# Patient Record
Sex: Male | Born: 1970 | Race: White | Hispanic: No | Smoking: Former smoker
Health system: Southern US, Community
[De-identification: ages and names within clinical notes are randomized; demographics above are authoritative.]

## PROBLEM LIST (undated history)

## (undated) DIAGNOSIS — R51 Headache: Secondary | ICD-10-CM

## (undated) DIAGNOSIS — G8929 Other chronic pain: Secondary | ICD-10-CM

## (undated) DIAGNOSIS — T148XXA Other injury of unspecified body region, initial encounter: Secondary | ICD-10-CM

## (undated) DIAGNOSIS — K219 Gastro-esophageal reflux disease without esophagitis: Secondary | ICD-10-CM

## (undated) DIAGNOSIS — I509 Heart failure, unspecified: Secondary | ICD-10-CM

## (undated) DIAGNOSIS — R519 Headache, unspecified: Secondary | ICD-10-CM

## (undated) DIAGNOSIS — I10 Essential (primary) hypertension: Secondary | ICD-10-CM

## (undated) DIAGNOSIS — E162 Hypoglycemia, unspecified: Secondary | ICD-10-CM

## (undated) DIAGNOSIS — J45909 Unspecified asthma, uncomplicated: Secondary | ICD-10-CM

## (undated) DIAGNOSIS — M542 Cervicalgia: Secondary | ICD-10-CM

## (undated) HISTORY — DX: Other injury of unspecified body region, initial encounter: T14.8XXA

## (undated) HISTORY — DX: Cervicalgia: M54.2

## (undated) HISTORY — DX: Headache, unspecified: R51.9

## (undated) HISTORY — DX: Other chronic pain: G89.29

## (undated) HISTORY — DX: Heart failure, unspecified: I50.9

## (undated) HISTORY — DX: Hypoglycemia, unspecified: E16.2

## (undated) HISTORY — DX: Unspecified asthma, uncomplicated: J45.909

## (undated) HISTORY — DX: Essential (primary) hypertension: I10

## (undated) HISTORY — PX: MOUTH SURGERY: SHX715

## (undated) HISTORY — DX: Gastro-esophageal reflux disease without esophagitis: K21.9

## (undated) HISTORY — DX: Headache: R51

---

## 2005-08-24 ENCOUNTER — Emergency Department: Payer: Self-pay | Admitting: Unknown Physician Specialty

## 2018-02-02 ENCOUNTER — Encounter: Payer: Self-pay | Admitting: Adult Health

## 2018-02-02 ENCOUNTER — Ambulatory Visit: Payer: Self-pay | Admitting: Adult Health

## 2018-02-02 ENCOUNTER — Other Ambulatory Visit: Payer: Self-pay

## 2018-02-02 VITALS — BP 148/95 | HR 76 | Temp 99.9°F | Ht 75.5 in | Wt 216.3 lb

## 2018-02-02 DIAGNOSIS — R51 Headache: Secondary | ICD-10-CM

## 2018-02-02 DIAGNOSIS — Z0000 Encounter for general adult medical examination without abnormal findings: Secondary | ICD-10-CM

## 2018-02-02 DIAGNOSIS — G8929 Other chronic pain: Secondary | ICD-10-CM

## 2018-02-02 DIAGNOSIS — I10 Essential (primary) hypertension: Secondary | ICD-10-CM | POA: Insufficient documentation

## 2018-02-02 DIAGNOSIS — R0609 Other forms of dyspnea: Secondary | ICD-10-CM

## 2018-02-02 DIAGNOSIS — E663 Overweight: Secondary | ICD-10-CM

## 2018-02-02 DIAGNOSIS — E162 Hypoglycemia, unspecified: Secondary | ICD-10-CM

## 2018-02-02 DIAGNOSIS — R21 Rash and other nonspecific skin eruption: Secondary | ICD-10-CM

## 2018-02-02 DIAGNOSIS — L853 Xerosis cutis: Secondary | ICD-10-CM

## 2018-02-02 DIAGNOSIS — M542 Cervicalgia: Secondary | ICD-10-CM | POA: Insufficient documentation

## 2018-02-02 DIAGNOSIS — I509 Heart failure, unspecified: Secondary | ICD-10-CM

## 2018-02-02 DIAGNOSIS — K219 Gastro-esophageal reflux disease without esophagitis: Secondary | ICD-10-CM | POA: Insufficient documentation

## 2018-02-02 DIAGNOSIS — T148XXA Other injury of unspecified body region, initial encounter: Secondary | ICD-10-CM | POA: Insufficient documentation

## 2018-02-02 DIAGNOSIS — R519 Headache, unspecified: Secondary | ICD-10-CM | POA: Insufficient documentation

## 2018-02-02 MED ORDER — PREDNISONE 10 MG PO TABS: 10.0000 mg | ORAL_TABLET | Freq: Every day | ORAL | 0 refills | Status: AC

## 2018-02-02 MED ORDER — ALBUTEROL SULFATE HFA 108 (90 BASE) MCG/ACT IN AERS
2.0000 | INHALATION_SPRAY | Freq: Four times a day (QID) | RESPIRATORY_TRACT | 2 refills | Status: DC | PRN
Start: 2018-02-02 — End: 2018-02-02

## 2018-02-02 MED ORDER — HYDROCORTISONE 2.5 % EX OINT: TOPICAL_OINTMENT | Freq: Two times a day (BID) | CUTANEOUS | 2 refills | Status: AC

## 2018-02-02 MED ORDER — HYDROCERIN EX CREA: 1.0000 "application " | TOPICAL_CREAM | Freq: Two times a day (BID) | CUTANEOUS | 2 refills | Status: DC

## 2018-02-02 MED ORDER — CARVEDILOL 25 MG PO TABS: 25.0000 mg | ORAL_TABLET | Freq: Two times a day (BID) | ORAL | 2 refills | Status: DC

## 2018-02-02 MED ORDER — FUROSEMIDE 20 MG PO TABS: 20.0000 mg | ORAL_TABLET | Freq: Every day | ORAL | 1 refills | Status: DC

## 2018-02-02 MED ORDER — OMEPRAZOLE 20 MG PO CPDR: 20.0000 mg | DELAYED_RELEASE_CAPSULE | Freq: Every day | ORAL | 2 refills | Status: DC

## 2018-02-02 MED ORDER — POTASSIUM CHLORIDE ER 10 MEQ PO TBCR: 10.0000 meq | EXTENDED_RELEASE_TABLET | Freq: Every day | ORAL | 2 refills | Status: DC

## 2018-02-02 MED ORDER — IBUPROFEN 800 MG PO TABS: 800.0000 mg | ORAL_TABLET | Freq: Three times a day (TID) | ORAL | 1 refills | Status: DC | PRN

## 2018-02-02 MED ORDER — ALBUTEROL SULFATE HFA 108 (90 BASE) MCG/ACT IN AERS: 2.0000 | INHALATION_SPRAY | Freq: Four times a day (QID) | RESPIRATORY_TRACT | 2 refills | Status: DC | PRN

## 2018-02-02 NOTE — Patient Instructions (Addendum)
Fat and Cholesterol Restricted Diet Getting too much fat and cholesterol in your diet may cause health problems. Following this diet helps keep your fat and cholesterol at normal levels. This can keep you from getting sick. What types of fat should I choose?  Choose monosaturated and polyunsaturated fats. These are found in foods such as olive oil, canola oil, flaxseeds, walnuts, almonds, and seeds.  Eat more omega-3 fats. Good choices include salmon, mackerel, sardines, tuna, flaxseed oil, and ground flaxseeds.  Limit saturated fats. These are in animal products such as meats, butter, and cream. They can also be in plant products such as palm oil, palm kernel oil, and coconut oil.  Avoid foods with partially hydrogenated oils in them. These contain trans fats. Examples of foods that have trans fats are stick margarine, some tub margarines, cookies, crackers, and other baked goods. What general guidelines do I need to follow?  Check food labels. Look for the words "trans fat" and "saturated fat."  When preparing a meal: ? Fill half of your plate with vegetables and green salads. ? Fill one fourth of your plate with whole grains. Look for the word "whole" as the first word in the ingredient list. ? Fill one fourth of your plate with lean protein foods.  Eat more foods that have fiber, like apples, carrots, beans, peas, and barley.  Eat more home-cooked foods. Eat less at restaurants and buffets.  Limit or avoid alcohol.  Limit foods high in starch and sugar.  Limit fried foods.  Cook foods without frying them. Baking, boiling, grilling, and broiling are all great options.  Lose weight if you are overweight. Losing even a small amount of weight can help your overall health. It can also help prevent diseases such as diabetes and heart disease. What foods can I eat? Grains Whole grains, such as whole wheat or whole grain breads, crackers, cereals, and pasta. Unsweetened oatmeal,  bulgur, barley, quinoa, or brown rice. Corn or whole wheat flour tortillas. Vegetables Fresh or frozen vegetables (raw, steamed, roasted, or grilled). Green salads. Fruits All fresh, canned (in natural juice), or frozen fruits. Meat and Other Protein Products Ground beef (85% or leaner), grass-fed beef, or beef trimmed of fat. Skinless chicken or [place]. Ground chicken or [place]. Pork trimmed of fat. All fish and seafood. Eggs. Dried beans, peas, or lentils. Unsalted nuts or seeds. Unsalted canned or dry beans. Dairy Low-fat dairy products, such as skim or 1% milk, 2% or reduced-fat cheeses, low-fat ricotta or cottage cheese, or plain low-fat yogurt. Fats and Oils Tub margarines without trans fats. Light or reduced-fat mayonnaise and salad dressings. Avocado. Olive, canola, sesame, or safflower oils. Natural peanut or almond butter (choose ones without added sugar and oil). The items listed above may not be a complete list of recommended foods or beverages. Contact your dietitian for more options. What foods are not recommended? Grains White bread. White pasta. White rice. Cornbread. Bagels, pastries, and croissants. Crackers that contain trans fat. Vegetables White potatoes. Corn. Creamed or fried vegetables. Vegetables in a cheese sauce. Fruits Dried fruits. Canned fruit in light or heavy syrup. Fruit juice. Meat and Other Protein Products Fatty cuts of meat. Ribs, chicken wings, bacon, sausage, bologna, salami, chitterlings, fatback, hot dogs, bratwurst, and packaged luncheon meats. Liver and organ meats. Dairy Whole or 2% milk, cream, half-and-half, and cream cheese. Whole milk cheeses. Whole-fat or sweetened yogurt. Full-fat cheeses. Nondairy creamers and whipped toppings. Processed cheese, cheese spreads, or cheese curds. Sweets and Desserts  Corn syrup, sugars, honey, and molasses. Candy. Jam and jelly. Syrup. Sweetened cereals. Cookies, pies, cakes, donuts, muffins, and ice  cream. Fats and Oils Butter, stick margarine, lard, shortening, ghee, or bacon fat. Coconut, palm kernel, or palm oils. Beverages Alcohol. Sweetened drinks (such as sodas, lemonade, and fruit drinks or punches). The items listed above may not be a complete list of foods and beverages to avoid. Contact your dietitian for more information. This information is not intended to replace advice given to you by your health care provider. Make sure you discuss any questions you have with your health care provider. Document Released: 02/01/2012 Document Revised: 04/08/2016 Document Reviewed: 11/01/2013 Elsevier Interactive Patient Education  2018 [employer]  Shortness of Breath, Adult Shortness of breath means you have trouble breathing. Your lungs are organs for breathing. Follow these instructions at home: Pay attention to any changes in your symptoms. Take these actions to help with your condition:  Do not smoke. Smoking can cause shortness of breath. If you need help to quit smoking, ask your doctor.  Avoid things that can make it harder to breathe, such as: ? Mold. ? Dust. ? Air pollution. ? Chemical smells. ? Things that can cause allergy symptoms (allergens), if you have allergies.  Keep your living space clean and free of mold and dust.  Rest as needed. Slowly return to your usual activities.  Take over-the-counter and prescription medicines, including oxygen and inhaled medicines, only as told by your doctor.  Keep all follow-up visits as told by your doctor. This is important.  Contact a doctor if:  Your condition does not get better as soon as expected.  You have a hard time doing your normal activities, even after you rest.  You have new symptoms. Get help right away if:  You have trouble breathing when you are resting.  You feel light-headed or you faint.  You have a cough that is not helped by medicines.  You cough up blood.  You have pain with  breathing.  You have pain in your chest, arms, shoulders, or belly (abdomen).  You have a fever.  You cannot walk up stairs.  You cannot exercise the way you normally do. This information is not intended to replace advice given to you by your health care provider. Make sure you discuss any questions you have with your health care provider. Document Released: 01/19/2008 Document Revised: 08/19/2016 Document Reviewed: 08/19/2016 Elsevier Interactive Patient Education  2017 [employer]

## 2018-02-02 NOTE — Progress Notes (Signed)
Patient ID: Matthew Marquez, male   DOB: [birth year], 47 y.o.   MRN: [id number]  Chief Complaint  Patient presents with  . Shortness of Breath  . Headache   Initial office visit  HPI Matthew Marquez is a 47 y.o. male who presents for an initial office visit and health assessment. He was just recently released from prison. He is c/o shortness of breath x 1 week. Symptoms have gotten worse over the last 3 days. Occurs both at rest and with exertion. Associated with orthopnea and a non-productive cough.Minimal relief with rest. No associated chest pain, but report palpitations. He has hypertension and currently takes hydrochlorothiazide and carvedilol that he was prescribed 4 years ago. While in prison, he was taking the coreg and another diuretic that he can't remember.  He reports being diagnosed with congestive heart failure at one point but doesn't remember if he had an echo or not. He is also c/o a rash that has persisted for about 6 months. Blister rash, that itches and burns. He report minimal relief with OTC creams and scabies treatment x 2 while in jail. Had similar symptoms prior to incarceration.  C/O headaches that occur mostly in the morning and at night. No associated photophobia, nausea and vomiting. Headaches associated with aura. Reports moderate relief with BCP powder, motrin and tylenol.  He reports variations in appetite from good to poor. He was on prilosec for GERD and reports worsening heart burn with meals. States that food seems to get stuck in his esophagus.   Past Medical History:  Diagnosis Date  . Acid reflux   . Bruising   . Cervicalgia   . Chronic headaches   . Congestive heart failure ([place])    pt unsure if correct dx  . Hypertension   . Hypoglycemia      Family History  Problem Relation Age of Onset  . Cancer Mother   . Diabetes Maternal Grandmother   . Hypertension Maternal Grandmother     Social History Social History   Tobacco Use  . Smoking  status: Former Smoker    Last attempt to quit: 2015    Years since quitting: 4.4  . Smokeless tobacco: Never Used  Substance Use Topics  . Alcohol use: Not on file  . Drug use: Not on file    No Known Allergies  Current Outpatient Medications  Medication Sig Dispense Refill  . carvedilol (COREG) 25 MG tablet Take 1 tablet (25 mg total) by mouth 2 (two) times daily with a meal. 90 tablet 2  . diphenhydrAMINE (BENADRYL) 25 MG tablet Take 25 mg by mouth every 6 (six) hours as needed.    Marland Kitchen albuterol (PROVENTIL HFA;VENTOLIN HFA) 108 (90 Base) MCG/ACT inhaler Inhale 2 puffs into the lungs every 6 (six) hours as needed for wheezing or shortness of breath. 1 Inhaler 2  . furosemide (LASIX) 20 MG tablet Take 1 tablet (20 mg total) by mouth daily. 30 tablet 1  . hydrocerin (EUCERIN) CREA Apply 1 application topically 2 (two) times daily. 450 g 2  . hydrocortisone 2.5 % ointment Apply topically 2 (two) times daily for 14 days. 60 g 2  . ibuprofen (ADVIL,MOTRIN) 800 MG tablet Take 1 tablet (800 mg total) by mouth 3 (three) times daily as needed. 90 tablet 1  . omeprazole (PRILOSEC) 20 MG capsule Take 1 capsule (20 mg total) by mouth daily. 90 capsule 2  . predniSONE (DELTASONE) 10 MG tablet Take 1 tablet (10 mg total) by mouth daily  with breakfast for 7 days. 7 tablet 0   No current facility-administered medications for this visit.     Review of Systems Review of Systems  Constitutional: Positive for fatigue.  HENT: Positive for sinus pressure and sinus pain.   Eyes: Negative.   Respiratory: Positive for shortness of breath.   Cardiovascular: Positive for palpitations.  Gastrointestinal: Negative.   Endocrine: Negative.   Genitourinary: Negative.   Musculoskeletal: Negative.   Skin: Positive for rash.  Allergic/Immunologic: Negative.   Neurological: Positive for headaches.  Hematological: Bruises/bleeds easily.  Psychiatric/Behavioral: Negative.     Blood pressure (!) 148/95, pulse  76, temperature 99.9 F (37.7 C), height 6' 3.5" (1.918 m), weight 216 lb 4.8 oz (98.1 kg).  Physical Exam Physical Exam  Constitutional: He is oriented to person, place, and time. He appears well-developed and well-nourished.  HENT:  Head: Normocephalic and atraumatic.  Mouth/Throat: Oropharynx is clear and moist.  Eyes: Pupils are equal, round, and reactive to light. EOM are normal.  Neck: Normal range of motion. Neck supple. No JVD present. No thyromegaly present.  Cardiovascular: Normal rate, regular rhythm, normal heart sounds and intact distal pulses.  Pulses:      Carotid pulses are on the right side with bruit, and on the left side with bruit. Pulmonary/Chest: Effort normal and breath sounds normal.  Abdominal: Soft. Bowel sounds are normal.  Musculoskeletal: Normal range of motion.       Right lower leg: Normal.       Left lower leg: Normal.  Neurological: He is alert and oriented to person, place, and time.  Skin: Skin is warm and dry. Rash noted.  Psychiatric: He has a normal mood and affect. His behavior is normal.  Nursing note and vitals reviewed.   Data Reviewed Ordered labs: CBC, CMP, lipid panel, Hg A1C, BNP and TSH  Assessment and plan 1. Health maintenance examination Exam unremarkable. Will screen for cardiovascular risk factors and complications - Hemoglobin A1c - Lipid panel - TSH - B Nat Peptide - Comp Met (CMET) - CBC w/Diff - Urine Microalbumin w/creat. ratio - Urinalysis, Routine w reflex microscopic - DG Chest Portable 2 Views (NEONATE); Future  2. Overweight with body mass index (BMI) 25.0-29.9 Weight loss/life style modification advice given - Hemoglobin A1c - Lipid panel - TSH - B Nat Peptide - Comp Met (CMET) - CBC w/Diff - Urine Microalbumin w/creat. ratio - Urinalysis, Routine w reflex microscopic - DG Chest Portable 2 Views (NEONATE); Future  3. Hypoglycemia No recent episodes. S/SX of hypoglycemia reviewed with patient  4.  Hypertension, unspecified type Moderate control. D/C HCTZ. Patient advised to stop taking old medications. Refills provided for carvedilol and new script given for lasix 20 mg daily  5. Congestive heart failure, unspecified HF chronicity, unspecified heart failure type ([place]) No signs of volume overload on exam - B Nat Peptide  - PCXR -Will consider cardiology referral if no improvement with lasix and if CXR and labs are  abnormal.   6. Chronic nonintractable headache, unspecified headache type-most likely migraines Prn motrin  7. Cervicalgia PRN motrin and daily exercises reviewed. Will obtain x-ray if no improvement  8. Bruising No acute bruising noted. Will review labs  11. Dry skin dermatitis Patient advised to use Eucerin cream with Vaseline bid  RTC in 1 week [facility] care application  Magddalene S Tukov-Yual 02/02/2018, 3:20 PM

## 2018-02-03 ENCOUNTER — Ambulatory Visit
Admission: RE | Admit: 2018-02-03 | Discharge: 2018-02-03 | Disposition: A | Discharge status: Home / Self Care | Payer: Self-pay | Source: Ambulatory Visit | Attending: Adult Health | Admitting: Adult Health

## 2018-02-03 ENCOUNTER — Encounter
Admission: RE | Admit: 2018-02-03 | Discharge: 2018-02-03 | Disposition: A | Discharge status: Home / Self Care | Payer: Self-pay | Source: Ambulatory Visit | Attending: Adult Health | Admitting: Adult Health

## 2018-02-03 DIAGNOSIS — I509 Heart failure, unspecified: Secondary | ICD-10-CM | POA: Insufficient documentation

## 2018-02-03 DIAGNOSIS — E049 Nontoxic goiter, unspecified: Secondary | ICD-10-CM | POA: Insufficient documentation

## 2018-02-03 LAB — HEMOGLOBIN A1C
Est. average glucose Bld gHb Est-mCnc: 108 mg/dL
Hgb A1c MFr Bld: 5.4 % (ref 4.8–5.6)

## 2018-02-03 LAB — CBC WITH DIFFERENTIAL/PLATELET
BASOS ABS: 0.1 10*3/uL (ref 0.0–0.2)
Basos: 1 %
EOS (ABSOLUTE): 0.3 10*3/uL (ref 0.0–0.4)
EOS: 4 %
HEMATOCRIT: 41.3 % (ref 37.5–51.0)
HEMOGLOBIN: 14 g/dL (ref 13.0–17.7)
IMMATURE GRANS (ABS): 0 10*3/uL (ref 0.0–0.1)
Immature Granulocytes: 1 %
LYMPHS: 22 %
Lymphocytes Absolute: 1.7 10*3/uL (ref 0.7–3.1)
MCH: 29.7 pg (ref 26.6–33.0)
MCHC: 33.9 g/dL (ref 31.5–35.7)
MCV: 88 fL (ref 79–97)
MONOCYTES: 12 %
Monocytes Absolute: 1 10*3/uL — ABNORMAL HIGH (ref 0.1–0.9)
NEUTROS ABS: 5 10*3/uL (ref 1.4–7.0)
Neutrophils: 60 %
Platelets: 284 10*3/uL (ref 150–450)
RBC: 4.72 x10E6/uL (ref 4.14–5.80)
RDW: 14.5 % (ref 12.3–15.4)
WBC: 8.1 10*3/uL (ref 3.4–10.8)

## 2018-02-03 LAB — LIPID PANEL
CHOLESTEROL TOTAL: 215 mg/dL — AB (ref 100–199)
Chol/HDL Ratio: 5.5 ratio — ABNORMAL HIGH (ref 0.0–5.0)
HDL: 39 mg/dL — AB (ref >39)
LDL CALC: 130 mg/dL — AB (ref 0–99)
Triglycerides: 231 mg/dL — ABNORMAL HIGH (ref 0–149)
VLDL CHOLESTEROL CAL: 46 mg/dL — AB (ref 5–40)

## 2018-02-03 LAB — COMPREHENSIVE METABOLIC PANEL
ALBUMIN: 4.4 g/dL (ref 3.5–5.5)
ALT: 23 IU/L (ref 0–44)
AST: 17 IU/L (ref 0–40)
Albumin/Globulin Ratio: 1.6 (ref 1.2–2.2)
Alkaline Phosphatase: 53 IU/L (ref 39–117)
BUN / CREAT RATIO: 12 (ref 9–20)
BUN: 14 mg/dL (ref 6–24)
Bilirubin Total: 0.2 mg/dL (ref 0.0–1.2)
CO2: 25 mmol/L (ref 20–29)
CREATININE: 1.19 mg/dL (ref 0.76–1.27)
Calcium: 10 mg/dL (ref 8.7–10.2)
Chloride: 107 mmol/L — ABNORMAL HIGH (ref 96–106)
GFR calc non Af Amer: 72 mL/min/{1.73_m2} (ref >59)
GFR, EST AFRICAN AMERICAN: 83 mL/min/{1.73_m2} (ref >59)
Globulin, Total: 2.8 g/dL (ref 1.5–4.5)
Glucose: 81 mg/dL (ref 65–99)
Potassium: 5.3 mmol/L — ABNORMAL HIGH (ref 3.5–5.2)
Sodium: 148 mmol/L — ABNORMAL HIGH (ref 134–144)
TOTAL PROTEIN: 7.2 g/dL (ref 6.0–8.5)

## 2018-02-03 LAB — MICROALBUMIN / CREATININE URINE RATIO
CREATININE, UR: 139.6 mg/dL
MICROALBUM., U, RANDOM: 22.4 ug/mL
Microalb/Creat Ratio: 16 mg/g creat (ref 0.0–30.0)

## 2018-02-03 LAB — TSH: TSH: 1.62 u[IU]/mL (ref 0.450–4.500)

## 2018-02-03 LAB — BRAIN NATRIURETIC PEPTIDE: BNP: 41.9 pg/mL (ref 0.0–100.0)

## 2018-02-09 ENCOUNTER — Ambulatory Visit: Payer: Self-pay | Admitting: Adult Health

## 2018-02-14 ENCOUNTER — Ambulatory Visit: Payer: Medicaid Other | Admitting: Adult Health Nurse Practitioner

## 2018-02-14 ENCOUNTER — Telehealth: Payer: Self-pay | Admitting: Pharmacy Technician

## 2018-02-14 DIAGNOSIS — E785 Hyperlipidemia, unspecified: Secondary | ICD-10-CM | POA: Insufficient documentation

## 2018-02-14 DIAGNOSIS — I509 Heart failure, unspecified: Secondary | ICD-10-CM

## 2018-02-14 DIAGNOSIS — J449 Chronic obstructive pulmonary disease, unspecified: Secondary | ICD-10-CM | POA: Insufficient documentation

## 2018-02-14 DIAGNOSIS — E782 Mixed hyperlipidemia: Secondary | ICD-10-CM

## 2018-02-14 DIAGNOSIS — E079 Disorder of thyroid, unspecified: Secondary | ICD-10-CM | POA: Insufficient documentation

## 2018-02-14 MED ORDER — ALBUTEROL SULFATE HFA 108 (90 BASE) MCG/ACT IN AERS: 2.0000 | INHALATION_SPRAY | Freq: Four times a day (QID) | RESPIRATORY_TRACT | 2 refills | Status: DC | PRN

## 2018-02-14 NOTE — Progress Notes (Signed)
  Patient: Matthew Marquez Male    DOB: May 05, 1971   47 y.o.   MRN: [id number] Visit Date: 02/14/2018  Today's Provider: Jacelyn Pieah Doles-Johnson, NP   Chief Complaint  Patient presents with  . Shortness of Breath  . Follow-up    xray and blood work  . Medication Refill    prednisone   Subjective:    HPI  LDL-130 at this time. States he has no history of HLD.    States that he has decreased energy, and SOB.  States that he used to smoke x 40 years up to 2 ppd.  Pt endorses childhood asthma.      CXR showed incidental right thyroid enlargement/mass.  Pt states that he has been told this before but never sought treatment.  TSH 1.620.  No Known Allergies Previous Medications   ALBUTEROL (PROVENTIL HFA;VENTOLIN HFA) 108 (90 BASE) MCG/ACT INHALER    Inhale 2 puffs into the lungs every 6 (six) hours as needed for wheezing or shortness of breath.   CARVEDILOL (COREG) 25 MG TABLET    Take 1 tablet (25 mg total) by mouth 2 (two) times daily with a meal.   DIPHENHYDRAMINE (BENADRYL) 25 MG TABLET    Take 25 mg by mouth every 6 (six) hours as needed.   FUROSEMIDE (LASIX) 20 MG TABLET    Take 1 tablet (20 mg total) by mouth daily.   HYDROCERIN (EUCERIN) CREA    Apply 1 application topically 2 (two) times daily.   HYDROCORTISONE 2.5 % OINTMENT    Apply topically 2 (two) times daily for 14 days.   IBUPROFEN (ADVIL,MOTRIN) 800 MG TABLET    Take 1 tablet (800 mg total) by mouth 3 (three) times daily as needed.   OMEPRAZOLE (PRILOSEC) 20 MG CAPSULE    Take 1 capsule (20 mg total) by mouth daily.   POTASSIUM CHLORIDE (K-DUR) 10 MEQ TABLET    Take 1 tablet (10 mEq total) by mouth daily.    Review of Systems  Respiratory: Positive for cough and shortness of breath.        Non-productive cough    Social History   Tobacco Use  . Smoking status: Former Smoker    Last attempt to quit: 2015    Years since quitting: 4.5  . Smokeless tobacco: Never Used  Substance Use Topics  . Alcohol use: Not  on file   Objective:   BP 138/88   Pulse 69   Temp 98.1 F (36.7 C)   Wt 213 lb 12.8 oz (97 kg)   BMI 26.37 kg/m   Physical Exam  Constitutional: He appears well-developed and well-nourished.  Neck: Thyromegaly present.  Cardiovascular: Normal rate, regular rhythm and normal heart sounds.  Pulmonary/Chest: Effort normal and breath sounds normal. No respiratory distress. He has no wheezes.  Abdominal: Soft. Bowel sounds are normal.  Skin: Skin is warm and dry.        Assessment & Plan:        Stop Potassium supplementation at this time due to K of 5.3  Will reevaluate on next labs.   COPD:  Continue Albuterol PRN. Will send rx to [facility]. Monitor for worsening symptoms.   FU in 4 weeks to review symptoms- if not improved may consider adding Spiriva.   Discussed HLD- would like to modify diet and increase exercise.  Reevaluate with next labs.   Will order thyroid ultrasound.    Jacelyn Pieah Doles-Johnson, NP   [facility] [place] [place]

## 2018-02-14 NOTE — Telephone Encounter (Signed)
Unable to reach patient by phone.  Phone number listed is no longer working.  Patient needs to complete eligibility.  No additional medication assistance will be provided [facility] until eligibility has been determined.    [facility].  [facility] will notifiy patient that [facility] still needs to [facility].  Sherilyn DacostaBetty J. Revonda Marquez Care Manager [facility]

## 2018-02-21 ENCOUNTER — Ambulatory Visit: Payer: Medicaid Other

## 2018-03-20 ENCOUNTER — Ambulatory Visit: Payer: Medicaid Other | Admitting: Pharmacy Technician

## 2018-03-20 NOTE — Progress Notes (Signed)
Patient scheduled for eligibility appointment at [facility].  Patient did not show for the appointment on March 20, 2018 at 9:00a.m.  Contacted patient.  Patient stated that his means of transportation is motorcycle.  Was concerned about rain, so decided not to come to the appointment.  Appointment has been rescheduled to August 8th at 10:30a.m.Matthew Marquez.  [facility] unable to provide additional medication assistance until eligibility is determined.  Patient verbally acknowledged over phone that he understood.  Sherilyn DacostaBetty J. [place] Care Manager [facility]

## 2018-03-21 ENCOUNTER — Ambulatory Visit: Payer: Medicaid Other

## 2018-03-23 ENCOUNTER — Encounter (INDEPENDENT_AMBULATORY_CARE_PROVIDER_SITE_OTHER): Payer: Self-pay

## 2018-03-23 ENCOUNTER — Ambulatory Visit: Payer: Medicaid Other | Admitting: Pharmacy Technician

## 2018-03-23 DIAGNOSIS — Z79899 Other long term (current) drug therapy: Secondary | ICD-10-CM

## 2018-03-23 NOTE — Progress Notes (Signed)
Met with patient completed financial assistance application for [place] due to recent hospital visit.  Patient agreed to be responsible for gathering financial information and forwarding to appropriate department in [facility].    Patient shared that he had seen a provider at [facility] a little over a month ago.  Provider prescribed a rescue inhaler and hydrocortisone for bumps on his hands.  Patient feels like his breathing has not improved.  Patient also stated that his rash is not better on as hands as well.  Has had scabies in the past.  Patient asked that I send a referral to [facility].  Sent request to Lorrie Carter-[facility].  Completed [facility] application and contract.  Patient agreed to all terms of the [facility] contract.    Patient approved to receive medication assistance at [facility], as long as eligibility criteria continues to be met.   Provided patient with Civil engineer, contracting based on his particular needs.    [facility] Prescription Application completed with patient.  Forwarded to [facility] for signature.  Upon receipt of signed application from provider, [facility] Prescription Application will be submitted to [place].  [place] [facility]

## 2018-04-19 ENCOUNTER — Telehealth: Payer: Self-pay | Admitting: Pharmacist

## 2018-04-19 NOTE — Telephone Encounter (Signed)
04/19/2018 8:45:17 AM - [facility] HFA  04/19/18 Faxed [employer] application for enrollment for [facility] HFA Inhale 2 puffs into the lungs every 6 hours as needed for wheezing or shortness of breath.Forde Radon

## 2018-06-06 ENCOUNTER — Telehealth: Payer: Self-pay | Admitting: Urology

## 2018-06-06 ENCOUNTER — Ambulatory Visit: Payer: Medicaid Other | Admitting: Urology

## 2018-06-06 VITALS — BP 149/92 | HR 75 | Temp 97.6°F | Ht 71.0 in | Wt 215.5 lb

## 2018-06-06 DIAGNOSIS — L989 Disorder of the skin and subcutaneous tissue, unspecified: Secondary | ICD-10-CM

## 2018-06-06 DIAGNOSIS — I509 Heart failure, unspecified: Secondary | ICD-10-CM

## 2018-06-06 MED ORDER — DIPHENHYDRAMINE HCL 25 MG PO TABS: 25.0000 mg | ORAL_TABLET | Freq: Four times a day (QID) | ORAL | 0 refills | Status: DC | PRN

## 2018-06-06 MED ORDER — CARVEDILOL 25 MG PO TABS: 25.0000 mg | ORAL_TABLET | Freq: Two times a day (BID) | ORAL | 2 refills | Status: DC

## 2018-06-06 MED ORDER — FLUTICASONE-SALMETEROL 250-50 MCG/DOSE IN AEPB: 1.0000 | INHALATION_SPRAY | Freq: Two times a day (BID) | RESPIRATORY_TRACT | 3 refills | Status: DC

## 2018-06-06 MED ORDER — IBUPROFEN 800 MG PO TABS: 800.0000 mg | ORAL_TABLET | Freq: Three times a day (TID) | ORAL | 1 refills | Status: DC | PRN

## 2018-06-06 MED ORDER — OMEPRAZOLE 20 MG PO CPDR: 20.0000 mg | DELAYED_RELEASE_CAPSULE | Freq: Every day | ORAL | 2 refills | Status: DC

## 2018-06-06 MED ORDER — HYDROCERIN EX CREA: 1.0000 "application " | TOPICAL_CREAM | Freq: Two times a day (BID) | CUTANEOUS | 2 refills | Status: DC

## 2018-06-06 MED ORDER — FUROSEMIDE 20 MG PO TABS: 20.0000 mg | ORAL_TABLET | Freq: Every day | ORAL | 1 refills | Status: DC

## 2018-06-06 MED ORDER — ALBUTEROL SULFATE HFA 108 (90 BASE) MCG/ACT IN AERS: 2.0000 | INHALATION_SPRAY | Freq: Four times a day (QID) | RESPIRATORY_TRACT | 2 refills | Status: DC | PRN

## 2018-06-06 NOTE — Progress Notes (Signed)
  Patient: Matthew Marquez Male    DOB: 1971/07/06   47 y.o.   MRN: [id number] Visit Date: 06/06/2018  Today's Provider: Michiel Cowboy, PA-C   Chief Complaint  Patient presents with  . Medication Refill    lasix, doesn't feel his albuterol is working  . Skin Problem    mark on face that doesn't go away - concerned about potential cancer risk   . Rash    rash in armpit that hasn't gotten better with prescribed cream    Subjective:    HPI Has been without Lasix for three months  Feels albuterol is not effective - chest x -ray show possible thyroid enlargement - thyroid US ordered - patient states he was never contacted  5 to 6 months ago he started having red bumps with fluid in them and when the bump pops and then it starts itching    Lesion on left temple that scabs over, scabs falls off, it bleeds and then starts the process over again - present for 7 months     No Known Allergies Previous Medications   No medications on file    Review of Systems  Constitutional: Negative.   HENT: Negative.   Eyes: Negative.   Respiratory: Positive for cough and chest tightness.   Cardiovascular: Negative.   Gastrointestinal: Negative.   Endocrine: Negative.   Genitourinary: Negative.   Musculoskeletal: Negative.   Allergic/Immunologic: Negative.   Neurological: Negative.   Hematological: Negative.   Psychiatric/Behavioral: Negative.     Social History   Tobacco Use  . Smoking status: Former Smoker    Last attempt to quit: 2015    Years since quitting: 4.8  . Smokeless tobacco: Never Used  Substance Use Topics  . Alcohol use: Not on file   Objective:   BP (!) 149/92   Pulse 75   Temp 97.6 F (36.4 C)   Ht 5\' 11"  (1.803 m)   Wt 215 lb 8 oz (97.8 kg)   BMI 30.06 kg/m   Physical Exam  Constitutional: He is oriented to person, place, and time. He appears well-developed and well-nourished.  HENT:  Head: Normocephalic and atraumatic.  Right Ear: External ear  normal.  Left Ear: External ear normal.  Eyes: Pupils are equal, round, and reactive to light. Conjunctivae and EOM are normal.  Neck: Normal range of motion. Neck supple.  Cardiovascular: Normal rate, regular rhythm and normal heart sounds.  Pulmonary/Chest: Effort normal and breath sounds normal.  Abdominal: Soft. Bowel sounds are normal.  Musculoskeletal: Normal range of motion.  Neurological: He is alert and oriented to person, place, and time.  Psychiatric: He has a normal mood and affect. His behavior is normal. Judgment and thought content normal.        Assessment & Plan:  1. COPD  Continue albuterol as a rescue  Add Advair 250/50 2 puffs bid  2. Suspicious lesion on face  Refer to dermatology  3. Rash  Refer to dermatology  4. CHF  Refilled Lasix  5. HTN   had been out of meds    Michiel Cowboy, PA-C   [facility]

## 2018-06-06 NOTE — Telephone Encounter (Signed)
Teah ordered a thyroid US on Mr. Matthew Marquez in July and he said no one contacted him.

## 2018-06-07 LAB — BASIC METABOLIC PANEL
BUN/Creatinine Ratio: 16 (ref 9–20)
BUN: 24 mg/dL (ref 6–24)
CALCIUM: 9.4 mg/dL (ref 8.7–10.2)
CO2: 19 mmol/L — ABNORMAL LOW (ref 20–29)
Chloride: 104 mmol/L (ref 96–106)
Creatinine, Ser: 1.5 mg/dL — ABNORMAL HIGH (ref 0.76–1.27)
GFR calc Af Amer: 63 mL/min/{1.73_m2} (ref >59)
GFR calc non Af Amer: 54 mL/min/{1.73_m2} — ABNORMAL LOW (ref >59)
GLUCOSE: 69 mg/dL (ref 65–99)
Potassium: 3.7 mmol/L (ref 3.5–5.2)
Sodium: 141 mmol/L (ref 134–144)

## 2018-06-07 LAB — LIPID PANEL
CHOLESTEROL TOTAL: 213 mg/dL — AB (ref 100–199)
Chol/HDL Ratio: 6.1 ratio — ABNORMAL HIGH (ref 0.0–5.0)
HDL: 35 mg/dL — AB (ref >39)
LDL Calculated: 134 mg/dL — ABNORMAL HIGH (ref 0–99)
Triglycerides: 221 mg/dL — ABNORMAL HIGH (ref 0–149)
VLDL CHOLESTEROL CAL: 44 mg/dL — AB (ref 5–40)

## 2018-06-20 ENCOUNTER — Ambulatory Visit: Payer: Medicaid Other | Admitting: Family Medicine

## 2018-06-20 VITALS — BP 152/86 | HR 71 | Temp 98.0°F | Ht 72.0 in | Wt 219.9 lb

## 2018-06-20 DIAGNOSIS — Z09 Encounter for follow-up examination after completed treatment for conditions other than malignant neoplasm: Secondary | ICD-10-CM

## 2018-06-20 DIAGNOSIS — L853 Xerosis cutis: Secondary | ICD-10-CM

## 2018-06-20 DIAGNOSIS — Z0000 Encounter for general adult medical examination without abnormal findings: Secondary | ICD-10-CM

## 2018-06-20 DIAGNOSIS — I10 Essential (primary) hypertension: Secondary | ICD-10-CM

## 2018-06-20 DIAGNOSIS — J4540 Moderate persistent asthma, uncomplicated: Secondary | ICD-10-CM

## 2018-06-20 DIAGNOSIS — K219 Gastro-esophageal reflux disease without esophagitis: Secondary | ICD-10-CM

## 2018-06-20 MED ORDER — DIPHENHYDRAMINE HCL 25 MG PO TABS: 25.0000 mg | ORAL_TABLET | Freq: Four times a day (QID) | ORAL | 3 refills | Status: DC | PRN

## 2018-06-20 MED ORDER — MONTELUKAST SODIUM 10 MG PO TABS: 10.0000 mg | ORAL_TABLET | Freq: Every day | ORAL | 1 refills | Status: DC

## 2018-06-20 MED ORDER — IBUPROFEN 800 MG PO TABS: 800.0000 mg | ORAL_TABLET | Freq: Three times a day (TID) | ORAL | 3 refills | Status: DC | PRN

## 2018-06-20 MED ORDER — CARVEDILOL 25 MG PO TABS: 25.0000 mg | ORAL_TABLET | Freq: Two times a day (BID) | ORAL | 1 refills | Status: DC

## 2018-06-20 MED ORDER — ALBUTEROL SULFATE (2.5 MG/3ML) 0.083% IN NEBU: 2.5000 mg | INHALATION_SOLUTION | Freq: Four times a day (QID) | RESPIRATORY_TRACT | 12 refills | Status: DC | PRN

## 2018-06-20 MED ORDER — ALBUTEROL SULFATE HFA 108 (90 BASE) MCG/ACT IN AERS: 2.0000 | INHALATION_SPRAY | Freq: Four times a day (QID) | RESPIRATORY_TRACT | 12 refills | Status: DC | PRN

## 2018-06-20 MED ORDER — FUROSEMIDE 20 MG PO TABS: 20.0000 mg | ORAL_TABLET | Freq: Every day | ORAL | 1 refills | Status: DC

## 2018-06-20 MED ORDER — OMEPRAZOLE 20 MG PO CPDR: 20.0000 mg | DELAYED_RELEASE_CAPSULE | Freq: Every day | ORAL | 2 refills | Status: DC

## 2018-06-20 NOTE — Patient Instructions (Signed)
Montelukast oral tablets What is this medicine? MONTELUKAST (mon te LOO kast) is used to prevent and treat the symptoms of asthma. It is also used to treat allergies. Do not use for an acute asthma attack. This medicine may be used for other purposes; ask your health care provider or pharmacist if you have questions. COMMON BRAND NAME(S): Singulair What should I tell my health care provider before I take this medicine? They need to know if you have any of these conditions: -liver disease -an unusual or allergic reaction to montelukast, other medicines, foods, dyes, or preservatives -pregnant or trying to get pregnant -breast-feeding How should I use this medicine? This medicine should be given by mouth. Follow the directions on the prescription label. Take this medicine at the same time every day. You may take this medicine with or without meals. Do not chew the tablets. Do not stop taking your medicine unless your doctor tells you to. Talk to your pediatrician regarding the use of this medicine in children. Special care may be needed. While this drug may be prescribed for children as young as 15 years of age for selected conditions, precautions do apply. Overdosage: If you think you have taken too much of this medicine contact a poison control center or emergency room at once. NOTE: This medicine is only for you. Do not share this medicine with others. What if I miss a dose? If you miss a dose, take it as soon as you can. If it is almost time for your next dose, take only that dose. Do not take double or extra doses. What may interact with this medicine? -anti-infectives like rifampin and rifabutin -medicines for diabetes like rosiglitazone and repaglinide -medicines for seizures like phenytoin, phenobarbital, and carbamazepine -paclitaxel This list may not describe all possible interactions. Give your health care provider a list of all the medicines, herbs, non-prescription drugs, or  dietary supplements you use. Also tell them if you smoke, drink alcohol, or use illegal drugs. Some items may interact with your medicine. What should I watch for while using this medicine? Visit your doctor or health care professional for regular checks on your progress. Tell your doctor or health care professional if your allergy or asthma symptoms do not improve. Take your medicine even when you do not have symptoms. Do not stop taking any of your medicine(s) unless your doctor tells you to. If you have asthma, talk to your doctor about what to do in an acute asthma attack. Always have your inhaled rescue medicine for asthma attacks with you. Patients and their families should watch for new or worsening thoughts of suicide or depression. Also watch for sudden changes in feelings such as feeling anxious, agitated, panicky, irritable, hostile, aggressive, impulsive, severely restless, overly excited and hyperactive, or not being able to sleep. Any worsening of mood or thoughts of suicide or dying should be reported to your health care professional right away. What side effects may I notice from receiving this medicine? Side effects that you should report to your doctor or health care professional as soon as possible: -allergic reactions like skin rash or hives, or swelling of the face, lips, or tongue -breathing problems -confusion -dark urine -fever or infection -flu-like symptoms -hallucinations -painful lumps under the skin -pain, tingling, numbness in the hands or feet -sinus pain or swelling -suicidal thoughts or other mood changes -tremors -trouble sleeping -uncontrolled muscle movements -unusual bleeding or bruising -yellowing of the eyes or skin Side effects that usually do not require   medical attention (report to your doctor or health care professional if they continue or are bothersome): -cough -dizziness -drowsiness -headache -nightmares -stomach upset -stuffy nose This list may  not describe all possible side effects. Call your doctor for medical advice about side effects. You may report side effects to FDA at 1-800-FDA-1088. Where should I keep my medicine? Keep out of the reach of children. Store at room temperature between 15 and 30 degrees C (59 and 86 degrees F). Protect from light and moisture. Keep this medicine in the original bottle. Throw away any unused medicine after the expiration date. NOTE: This sheet is a summary. It may not cover all possible information. If you have questions about this medicine, talk to your doctor, pharmacist, or health care provider.  2018 Elsevier/Gold Standard (2015-08-04 09:40:44) Albuterol inhalation solution What is this medicine? ALBUTEROL (al Gaspar Bidding) is a bronchodilator. It helps to open up the airways in your lungs to make it easier to breathe. This medicine is used to treat and to prevent bronchospasm. This medicine may be used for other purposes; ask your health care provider or pharmacist if you have questions. COMMON BRAND NAME(S): Accuneb, Proventil What should I tell my health care provider before I take this medicine? They need to know if you have any of the following conditions: -diabetes -heart disease or irregular heartbeat -high blood pressure -pheochromocytoma -seizures -thyroid disease -an unusual or allergic reaction to albuterol, levalbuterol, sulfites, other medicines, foods, dyes, or preservatives -pregnant or trying to get pregnant -breast-feeding How should I use this medicine? This medicine is used in a nebulizer. Nebulizers make a liquid into an aerosol that you breathe in through your mouth or your mouth and nose into your lungs. You will be taught how to use your nebulizer. Follow the directions on your prescription label. Take your medicine at regular intervals. Do not use more often than directed. Talk to your pediatrician regarding the use of this medicine in children. Special care may be  needed. Overdosage: If you think you have taken too much of this medicine contact a poison control center or emergency room at once. NOTE: This medicine is only for you. Do not share this medicine with others. What if I miss a dose? If you miss a dose, use it as soon as you can. If it is almost time for your next dose, use only that dose. Do not use double or extra doses. What may interact with this medicine? -anti-infectives like chloroquine and pentamidine -caffeine -cisapride -diuretics -medicines for colds -medicines for depression or emotional or psychotic conditions -medicines for weight loss including some herbal products -methadone -some antibiotics like clarithromycin, erythromycin, levofloxacin, and linezolid -some heart medicines -steroid hormones like dexamethasone, cortisone, hydrocortisone -theophylline -thyroid hormones This list may not describe all possible interactions. Give your health care provider a list of all the medicines, herbs, non-prescription drugs, or dietary supplements you use. Also tell them if you smoke, drink alcohol, or use illegal drugs. Some items may interact with your medicine. What should I watch for while using this medicine? Tell your doctor or health care professional if your symptoms do not improve. Do not use extra albuterol. Call your doctor right away if your asthma or bronchitis gets worse while you are using this medicine. If your mouth gets dry try chewing sugarless gum or sucking hard candy. Drink water as directed. What side effects may I notice from receiving this medicine? Side effects that you should report to your doctor or  health care professional as soon as possible: -allergic reactions like skin rash, itching or hives, swelling of the face, lips, or tongue -breathing problems -chest pain -feeling faint or lightheaded, falls -high blood pressure -irregular heartbeat -fever -muscle cramps or weakness -pain, tingling, numbness  in the hands or feet -vomiting Side effects that usually do not require medical attention (report to your doctor or health care professional if they continue or are bothersome): -cough -difficulty sleeping -headache -nervousness, trembling -stomach upset -stuffy or runny nose -throat irritation -unusual taste This list may not describe all possible side effects. Call your doctor for medical advice about side effects. You may report side effects to FDA at 1-800-FDA-1088. Where should I keep my medicine? Keep out of the reach of children. Store between 2 and 25 degrees C (36 and 77 degrees F). Do not freeze. Protect from light. Throw away any unused medicine after the expiration date. Most products are kept in the foil package until time of use. Some products can be used up to 1 week after they are removed from the foil pouch. Check the instructions that come with your medicine. NOTE: This sheet is a summary. It may not cover all possible information. If you have questions about this medicine, talk to your doctor, pharmacist, or health care provider.  2018 Elsevier/Gold Standard (2011-04-23 15:19:55)

## 2018-06-20 NOTE — Progress Notes (Signed)
Follow Up  Subjective:    Patient ID: Matthew Marquez, male    DOB: Jun 13, 1971, 47 y.o.   MRN: [id number]  No chief complaint on file.  HPI  [name] is a 47 year old male with a past medical history of Hypoglycemia, Hypertension, CHF, Chronic Headaches, Cervialgia, Bruising, and Acid Reflux. He is here today for follow up of his chronic diseases.   Current Status: Since his last office visit, he has been having increasing episodes of shortness of breath. Patient states that he has not been able to call Dermatology referral back because he works in [employer] and cannot get cell phone service where he works from [employer], Monday-Friday. He has multiple skin lesions that need to be assessed by Dermatologist. He will attempt to call tomorrow before referral is closed. He denies visual changes, chest pain, cough, shortness of breath, heart palpitations, and falls. He has occasionally headaches and dizziness with position changes. Denies severe headaches, confusion, seizures, double vision, and blurred vision, nausea and vomiting.  He denies fevers, chills, fatigue, recent infections, weight loss, and night sweats. He has not had any visual changes, and falls. No reports of GI problems such as diarrhea, and constipation. He has no reports of blood in stools, dysuria and hematuria. No depression or anxiety reported. He denies pain today.    Past Medical History:  Diagnosis Date  . Acid reflux   . Bruising   . Cervicalgia   . Chronic headaches   . Congestive heart failure (HCC)    pt unsure if correct dx  . Hypertension   . Hypoglycemia     Family History  Problem Relation Age of Onset  . Cancer Mother   . Diabetes Maternal Grandmother   . Hypertension Maternal Grandmother     Social History   Socioeconomic History  . Marital status: Legally Separated    Spouse name: Not on file  . Number of children: 3  . Years of education: 64  . Highest education level: GED or equivalent   Occupational History  . Occupation: unemployed  Social Needs  . Financial resource strain: Somewhat hard  . Food insecurity:    Worry: Often true    Inability: Often true  . Transportation needs:    Medical: Yes    Non-medical: Yes  Tobacco Use  . Smoking status: Former Smoker    Last attempt to quit: 2015    Years since quitting: 4.8  . Smokeless tobacco: Never Used  Substance and Sexual Activity  . Alcohol use: Yes    Alcohol/week: 1.0 standard drinks    Types: 1 Cans of beer per week    Comment: Socially / Occasionally   . Drug use: Never  . Sexual activity: Not Currently  Lifestyle  . Physical activity:    Days per week: Not on file    Minutes per session: Not on file  . Stress: Not on file  Relationships  . Social connections:    Talks on phone: Not on file    Gets together: Not on file    Attends religious service: Not on file    Active member of club or organization: Not on file    Attends meetings of clubs or organizations: Not on file    Relationship status: Not on file  . Intimate partner violence:    Fear of current or ex partner: No    Emotionally abused: No    Physically abused: No    Forced sexual activity: No  Other Topics Concern  . Not on file  Social History Narrative   Pt owns home but there is no power or water. He gets water and takes showers from his brothers house. Cooks on a grill. Right now he drives his brothers motorcycle. States he enjoys "living off the grid". Pt is single   Was on food stamps but can't reapply now. Also mentioned he is currently unemployed but taking his final GED test and looking for a job    Past Surgical History:  Procedure Laterality Date  . MOUTH SURGERY       There is no immunization history on file for this patient.  Current Meds  Medication Sig  . albuterol (PROVENTIL HFA;VENTOLIN HFA) 108 (90 Base) MCG/ACT inhaler Inhale 2 puffs into the lungs every 6 (six) hours as needed for wheezing or shortness of  breath.  . carvedilol (COREG) 25 MG tablet Take 1 tablet (25 mg total) by mouth 2 (two) times daily with a meal.  . diphenhydrAMINE (BENADRYL) 25 MG tablet Take 1 tablet (25 mg total) by mouth every 6 (six) hours as needed.  . Fluticasone-Salmeterol (ADVAIR DISKUS) 250-50 MCG/DOSE AEPB Inhale 1 puff into the lungs 2 (two) times daily.  . furosemide (LASIX) 20 MG tablet Take 1 tablet (20 mg total) by mouth daily.  . hydrocerin (EUCERIN) CREA Apply 1 application topically 2 (two) times daily.  Marland Kitchen ibuprofen (ADVIL,MOTRIN) 800 MG tablet Take 1 tablet (800 mg total) by mouth 3 (three) times daily as needed.  Marland Kitchen omeprazole (PRILOSEC) 20 MG capsule Take 1 capsule (20 mg total) by mouth daily.  . [DISCONTINUED] albuterol (PROVENTIL HFA;VENTOLIN HFA) 108 (90 Base) MCG/ACT inhaler Inhale 2 puffs into the lungs every 6 (six) hours as needed for wheezing or shortness of breath.  . [DISCONTINUED] carvedilol (COREG) 25 MG tablet Take 1 tablet (25 mg total) by mouth 2 (two) times daily with a meal.  . [DISCONTINUED] diphenhydrAMINE (BENADRYL) 25 MG tablet Take 1 tablet (25 mg total) by mouth every 6 (six) hours as needed.  . [DISCONTINUED] furosemide (LASIX) 20 MG tablet Take 1 tablet (20 mg total) by mouth daily.  . [DISCONTINUED] ibuprofen (ADVIL,MOTRIN) 800 MG tablet Take 1 tablet (800 mg total) by mouth 3 (three) times daily as needed.  . [DISCONTINUED] omeprazole (PRILOSEC) 20 MG capsule Take 1 capsule (20 mg total) by mouth daily.    No Known Allergies  BP (!) 152/86   Pulse 71   Temp 98 F (36.7 C)   Ht 6' (1.829 m)   Wt 219 lb 14.4 oz (99.7 kg)   BMI 29.82 kg/m   Objective:   Physical Exam  Constitutional: He is oriented to person, place, and time. He appears well-developed and well-nourished.  HENT:  Head: Normocephalic and atraumatic.  Eyes: Pupils are equal, round, and reactive to light. EOM are normal.  Neck: Normal range of motion. Neck supple.  Cardiovascular: Normal rate, regular  rhythm, normal heart sounds and intact distal pulses.  Pulmonary/Chest: Effort normal and breath sounds normal.  Abdominal: Soft. Bowel sounds are normal.  Neurological: He is oriented to person, place, and time.  Skin: Skin is warm and dry.  Psychiatric: He has a normal mood and affect. His behavior is normal. Judgment and thought content normal.  Nursing note and vitals reviewed.   Assessment & Plan:   1. Hypertension, unspecified type Blood pressure is elevated today at 152/86. Continue Carvadilol as prescribed. He will continue to decrease high sodium intake, excessive alcohol intake,  increase potassium intake, smoking cessation, and increase physical activity of at least 30 minutes of cardio activity daily. He will continue to follow Heart Healthy or DASH diet. - carvedilol (COREG) 25 MG tablet; Take 1 tablet (25 mg total) by mouth 2 (two) times daily with a meal.  Dispense: 180 tablet; Refill: 1 - furosemide (LASIX) 20 MG tablet; Take 1 tablet (20 mg total) by mouth daily.  Dispense: 90 tablet; Refill: 1  2. Moderate persistent asthma without complication Stable.  - albuterol (PROVENTIL) (2.5 MG/3ML) 0.083% nebulizer solution; Take 3 mLs (2.5 mg total) by nebulization every 6 (six) hours as needed for wheezing or shortness of breath.  Dispense: 75 mL; Refill: 12 - albuterol (PROVENTIL HFA;VENTOLIN HFA) 108 (90 Base) MCG/ACT inhaler; Inhale 2 puffs into the lungs every 6 (six) hours as needed for wheezing or shortness of breath.  Dispense: 1 Inhaler; Refill: 12  3. Dry skin dermatitis - diphenhydrAMINE (BENADRYL) 25 MG tablet; Take 1 tablet (25 mg total) by mouth every 6 (six) hours as needed.  Dispense: 30 tablet; Refill: 3  4. Gastroesophageal reflux disease without esophagitis - omeprazole (PRILOSEC) 20 MG capsule; Take 1 capsule (20 mg total) by mouth daily.  Dispense: 90 capsule; Refill: 2  5. Health maintenance examination  6. Follow up He will follow up in 2 months.  -  albuterol (PROVENTIL HFA;VENTOLIN HFA) 108 (90 Base) MCG/ACT inhaler; Inhale 2 puffs into the lungs every 6 (six) hours as needed for wheezing or shortness of breath.  Dispense: 1 Inhaler; Refill: 12 Meds ordered this encounter  Medications  . diphenhydrAMINE (BENADRYL) 25 MG tablet    Sig: Take 1 tablet (25 mg total) by mouth every 6 (six) hours as needed.    Dispense:  30 tablet    Refill:  3  . carvedilol (COREG) 25 MG tablet    Sig: Take 1 tablet (25 mg total) by mouth 2 (two) times daily with a meal.    Dispense:  180 tablet    Refill:  1  . furosemide (LASIX) 20 MG tablet    Sig: Take 1 tablet (20 mg total) by mouth daily.    Dispense:  90 tablet    Refill:  1  . ibuprofen (ADVIL,MOTRIN) 800 MG tablet    Sig: Take 1 tablet (800 mg total) by mouth 3 (three) times daily as needed.    Dispense:  30 tablet    Refill:  3  . omeprazole (PRILOSEC) 20 MG capsule    Sig: Take 1 capsule (20 mg total) by mouth daily.    Dispense:  90 capsule    Refill:  2  . albuterol (PROVENTIL) (2.5 MG/3ML) 0.083% nebulizer solution    Sig: Take 3 mLs (2.5 mg total) by nebulization every 6 (six) hours as needed for wheezing or shortness of breath.    Dispense:  75 mL    Refill:  12  . montelukast (SINGULAIR) 10 MG tablet    Sig: Take 1 tablet (10 mg total) by mouth at bedtime.    Dispense:  90 tablet    Refill:  1  . albuterol (PROVENTIL HFA;VENTOLIN HFA) 108 (90 Base) MCG/ACT inhaler    Sig: Inhale 2 puffs into the lungs every 6 (six) hours as needed for wheezing or shortness of breath.    Dispense:  1 Inhaler    Refill:  12    Raliegh Ip,  MSN, FNP-C [facility] [address] [place], [place] [address]  [phone]  

## 2018-07-11 ENCOUNTER — Telehealth: Payer: Self-pay | Admitting: Pharmacist

## 2018-07-11 NOTE — Telephone Encounter (Signed)
07/11/2018 2:31:52 PM - Advair 250/50 script to [facility]  07/11/18 Faxed Advair 250/50 script to [facility] for New Med.Forde RadonAJ

## 2018-08-22 ENCOUNTER — Ambulatory Visit: Payer: Medicaid Other

## 2018-11-15 ENCOUNTER — Telehealth: Payer: Self-pay | Admitting: Pharmacist

## 2018-11-15 NOTE — Telephone Encounter (Signed)
11/15/2018 9:30:47 AM - Call to [facility] for refills on Advair & Ventolin  11/15/2018 Called [facility] spoke with Macey-I faxed a script for Advair 07/11/2018 & have not received meds-according to Macey the Advair was shipped to patient's home on 07/12/18 and delivered on 07/17/2018. I was able today to place a refill for Advair 250/50 Rx#[id number] & Ventolin Rx# [id number] these will ship to our address. Patient enrollment with [facility] ends 04/20/2019.Forde Radon

## 2018-11-20 ENCOUNTER — Other Ambulatory Visit: Payer: Self-pay | Admitting: Urology

## 2018-11-27 ENCOUNTER — Other Ambulatory Visit: Payer: Self-pay | Admitting: Internal Medicine

## 2018-12-06 ENCOUNTER — Telehealth: Payer: Self-pay | Admitting: Pharmacy Technician

## 2018-12-06 NOTE — Telephone Encounter (Signed)
Patient has private insurance now through [employer]. We are unable to assist any further with medications. If the patient were to lose these benefits, he is welcome to reapply for our services.  Claybon Jabs, CPhT Recertification Specialist [facility]

## 2018-12-13 ENCOUNTER — Encounter: Payer: Self-pay | Admitting: Adult Health

## 2018-12-13 ENCOUNTER — Other Ambulatory Visit: Payer: Self-pay

## 2018-12-13 ENCOUNTER — Ambulatory Visit: Payer: 59 | Admitting: Adult Health

## 2018-12-13 VITALS — BP 144/108 | HR 80 | Resp 16 | Ht 75.0 in | Wt 217.0 lb

## 2018-12-13 DIAGNOSIS — K219 Gastro-esophageal reflux disease without esophagitis: Secondary | ICD-10-CM

## 2018-12-13 DIAGNOSIS — I10 Essential (primary) hypertension: Secondary | ICD-10-CM | POA: Diagnosis not present

## 2018-12-13 DIAGNOSIS — I509 Heart failure, unspecified: Secondary | ICD-10-CM

## 2018-12-13 DIAGNOSIS — R51 Headache: Secondary | ICD-10-CM | POA: Diagnosis not present

## 2018-12-13 DIAGNOSIS — G8929 Other chronic pain: Secondary | ICD-10-CM

## 2018-12-13 DIAGNOSIS — N529 Male erectile dysfunction, unspecified: Secondary | ICD-10-CM

## 2018-12-13 DIAGNOSIS — E049 Nontoxic goiter, unspecified: Secondary | ICD-10-CM

## 2018-12-13 MED ORDER — CARVEDILOL 25 MG PO TABS: 25.0000 mg | ORAL_TABLET | Freq: Two times a day (BID) | ORAL | 1 refills | Status: DC

## 2018-12-13 MED ORDER — LOSARTAN POTASSIUM 50 MG PO TABS: 50.0000 mg | ORAL_TABLET | Freq: Every day | ORAL | 0 refills | Status: DC

## 2018-12-13 NOTE — Patient Instructions (Signed)

## 2018-12-13 NOTE — Progress Notes (Signed)
[facility] [address] [place], [place] [address]  Internal MEDICINE  Office Visit Note  Patient Name: Matthew Marquez  [id number]  [id number]  Date of Service: 12/26/2018   Complaints/HPI Pt is here for establishment of PCP. Chief Complaint  Patient presents with  . Gastroesophageal Reflux    new patient establish care   . Allergic Rhinitis   . Headache    headaches every other day   . Hypertension    bp elevated    HPI Pt is here to establish care. He reports [facility] was [facility].  He now has insurance and needs a PCP.  He has chronic HTN for which he currently takes coreg  bid.  This does not appear to be controlling his BP at this time. He is also being treated for GERD  He denies tobacco, alcohol or illicit drug use.      Current Medication: Outpatient Encounter Medications as of 12/13/2018  Medication Sig  . ADVAIR DISKUS 250-50 MCG/DOSE AEPB INHALE 1 PUFF INTO THE LUNGS 2 TIMES A DAY  . albuterol (PROVENTIL HFA;VENTOLIN HFA) 108 (90 Base) MCG/ACT inhaler Inhale 2 puffs into the lungs every 6 (six) hours as needed for wheezing or shortness of breath.  Marland Kitchen albuterol (PROVENTIL) (2.5 MG/3ML) 0.083% nebulizer solution Take 3 mLs (2.5 mg total) by nebulization every 6 (six) hours as needed for wheezing or shortness of breath.  . diphenhydrAMINE (BENADRYL) 25 MG tablet Take 1 tablet (25 mg total) by mouth every 6 (six) hours as needed.  . furosemide (LASIX) 20 MG tablet Take 1 tablet (20 mg total) by mouth daily.  . hydrocerin (EUCERIN) CREA Apply 1 application topically 2 (two) times daily.  Marland Kitchen ibuprofen (ADVIL,MOTRIN) 800 MG tablet Take 1 tablet (800 mg total) by mouth 3 (three) times daily as needed.  . montelukast (SINGULAIR) 10 MG tablet Take 1 tablet (10 mg total) by mouth at bedtime.  Marland Kitchen omeprazole (PRILOSEC) 20 MG capsule Take 1 capsule (20 mg total) by mouth daily.  . [DISCONTINUED] carvedilol (COREG) 25 MG tablet Take 1 tablet (25 mg total)  by mouth 2 (two) times daily with a meal.  . losartan (COZAAR) 50 MG tablet Take 1 tablet (50 mg total) by mouth daily.   No facility-administered encounter medications on file as of 12/13/2018.     Surgical History: Past Surgical History:  Procedure Laterality Date  . MOUTH SURGERY      Medical History: Past Medical History:  Diagnosis Date  . Acid reflux   . Asthma   . Bruising   . Cervicalgia   . Chronic headaches   . Congestive heart failure (HCC)    pt unsure if correct dx  . Hypertension   . Hypoglycemia     Family History: Family History  Problem Relation Age of Onset  . Cancer Mother   . Diabetes Maternal Grandmother   . Hypertension Maternal Grandmother     Social History   Socioeconomic History  . Marital status: Legally Separated    Spouse name: Not on file  . Number of children: 3  . Years of education: 34  . Highest education level: GED or equivalent  Occupational History  . Occupation: unemployed  Social Needs  . Financial resource strain: Somewhat hard  . Food insecurity:    Worry: Often true    Inability: Often true  . Transportation needs:    Medical: Yes    Non-medical: Yes  Tobacco Use  . Smoking status:  Former Smoker    Last attempt to quit: 2015    Years since quitting: 5.3  . Smokeless tobacco: Never Used  Substance and Sexual Activity  . Alcohol use: Yes    Alcohol/week: 1.0 standard drinks    Types: 1 Cans of beer per week    Comment: Socially / Occasionally   . Drug use: Never  . Sexual activity: Not Currently  Lifestyle  . Physical activity:    Days per week: Not on file    Minutes per session: Not on file  . Stress: Not on file  Relationships  . Social connections:    Talks on phone: Not on file    Gets together: Not on file    Attends religious service: Not on file    Active member of club or organization: Not on file    Attends meetings of clubs or organizations: Not on file    Relationship status: Not on file  .  Intimate partner violence:    Fear of current or ex partner: No    Emotionally abused: No    Physically abused: No    Forced sexual activity: No  Other Topics Concern  . Not on file  Social History Narrative   Pt owns home but there is no power or water. He gets water and takes showers from his brothers house. Cooks on a grill. Right now he drives his brothers motorcycle. States he enjoys "living off the grid". Pt is single   Was on food stamps but can't reapply now. Also mentioned he is currently unemployed but taking his final GED test and looking for a job     Review of Systems  Constitutional: Negative.  Negative for chills, fatigue and unexpected weight change.  HENT: Negative.  Negative for congestion, rhinorrhea, sneezing and sore throat.   Eyes: Negative for redness.  Respiratory: Negative.  Negative for cough, chest tightness and shortness of breath.   Cardiovascular: Negative.  Negative for chest pain and palpitations.  Gastrointestinal: Negative.  Negative for abdominal pain, constipation, diarrhea, nausea and vomiting.  Endocrine: Negative.   Genitourinary: Negative.  Negative for dysuria and frequency.  Musculoskeletal: Negative.  Negative for arthralgias, back pain, joint swelling and neck pain.  Skin: Negative.  Negative for rash.  Allergic/Immunologic: Negative.   Neurological: Positive for headaches. Negative for tremors and numbness.  Hematological: Negative for adenopathy. Does not bruise/bleed easily.  Psychiatric/Behavioral: Negative.  Negative for behavioral problems, sleep disturbance and suicidal ideas. The patient is not nervous/anxious.     Vital Signs: BP (!) 144/108 (BP Location: Left Arm, Patient Position: Sitting, Cuff Size: Normal)   Pulse 80   Resp 16   Ht 6\' 3"  (1.905 m)   Wt 217 lb (98.4 kg)   SpO2 98%   BMI 27.12 kg/m    Physical Exam Vitals signs and nursing note reviewed.  Constitutional:      General: He is not in acute distress.     Appearance: He is well-developed. He is not diaphoretic.  HENT:     Head: Normocephalic and atraumatic.     Mouth/Throat:     Pharynx: No oropharyngeal exudate.  Eyes:     Pupils: Pupils are equal, round, and reactive to light.  Neck:     Musculoskeletal: Normal range of motion and neck supple.     Thyroid: No thyromegaly.     Vascular: No JVD.     Trachea: No tracheal deviation.  Cardiovascular:     Rate and Rhythm:  Normal rate and regular rhythm.     Heart sounds: Normal heart sounds. No murmur. No friction rub. No gallop.   Pulmonary:     Effort: Pulmonary effort is normal. No respiratory distress.     Breath sounds: Normal breath sounds. No wheezing or rales.  Chest:     Chest wall: No tenderness.  Abdominal:     Palpations: Abdomen is soft.     Tenderness: There is no abdominal tenderness. There is no guarding.  Musculoskeletal: Normal range of motion.  Lymphadenopathy:     Cervical: No cervical adenopathy.  Skin:    General: Skin is warm and dry.  Neurological:     Mental Status: He is alert and oriented to person, place, and time.     Cranial Nerves: No cranial nerve deficit.  Psychiatric:        Behavior: Behavior normal.        Thought Content: Thought content normal.        Judgment: Judgment normal.     Assessment/Plan: 1. Hypertension, unspecified type Continue Coreg and add losartan.  Start with 25mg  daily of losartan, and increase to 50mg  if pressure/headaches do not improve.  - losartan (COZAAR) 50 MG tablet; Take 1 tablet (50 mg total) by mouth daily.  Dispense: 30 tablet; Refill: 0  2. Congestive heart failure, unspecified HF chronicity, unspecified heart failure type (HCC) Stable, continue to use lasix as ordered. Consider ordering Echo at next visit for evaluation     3. Chronic nonintractable headache, unspecified headache type Headaches are likely due to uncontrolled HTN.  Will treat HTN, and see if headaches improve.   4. Gastroesophageal reflux  disease without esophagitis PT stable, continue using Prilosec as discussed.   5. Thyroid enlargement PT has questionable enlarged thyroid on previous x-ray, and on physical exam.  Will get US of thyroid.  - US THYROID; Future  6. Erectile dysfunction, unspecified erectile dysfunction type Discussed options for Erectile dysfunction.    General Counseling: Lucien MonsJeffrey verbalizes understanding of the findings of todays visit and agrees with plan of treatment. I have discussed any further diagnostic evaluation that may be needed or ordered today. We also reviewed his medications today. he has been encouraged to call the office with any questions or concerns that should arise related to todays visit.  Orders Placed This Encounter  Procedures  . US THYROID    Meds ordered this encounter  Medications  . losartan (COZAAR) 50 MG tablet    Sig: Take 1 tablet (50 mg total) by mouth daily.    Dispense:  30 tablet    Refill:  0    Time spent: 30 Minutes   This patient was seen by Blima LedgerAdam Tarae Wooden AGNP-C in Collaboration with Dr Lyndon CodeFozia M Khan as a part of collaborative care agreement  Johnna AcostaAdam J. Braysen Cloward AGNP-C Internal Medicine

## 2018-12-20 ENCOUNTER — Other Ambulatory Visit: Payer: Self-pay | Admitting: Adult Health

## 2018-12-21 LAB — COMPREHENSIVE METABOLIC PANEL
ALT: 20 IU/L (ref 0–44)
AST: 15 IU/L (ref 0–40)
Albumin/Globulin Ratio: 1.8 (ref 1.2–2.2)
Albumin: 4.4 g/dL (ref 4.0–5.0)
Alkaline Phosphatase: 80 IU/L (ref 39–117)
BUN/Creatinine Ratio: 17 (ref 9–20)
BUN: 21 mg/dL (ref 6–24)
Bilirubin Total: 0.3 mg/dL (ref 0.0–1.2)
CO2: 24 mmol/L (ref 20–29)
Calcium: 9.4 mg/dL (ref 8.7–10.2)
Chloride: 104 mmol/L (ref 96–106)
Creatinine, Ser: 1.23 mg/dL (ref 0.76–1.27)
GFR calc Af Amer: 79 mL/min/{1.73_m2} (ref >59)
GFR calc non Af Amer: 69 mL/min/{1.73_m2} (ref >59)
Globulin, Total: 2.5 g/dL (ref 1.5–4.5)
Glucose: 124 mg/dL — ABNORMAL HIGH (ref 65–99)
Potassium: 4.1 mmol/L (ref 3.5–5.2)
Sodium: 140 mmol/L (ref 134–144)
Total Protein: 6.9 g/dL (ref 6.0–8.5)

## 2018-12-21 LAB — FERRITIN: Ferritin: 105 ng/mL (ref 30–400)

## 2018-12-21 LAB — TSH: TSH: 0.927 u[IU]/mL (ref 0.450–4.500)

## 2018-12-21 LAB — CBC WITH DIFFERENTIAL/PLATELET
Basophils Absolute: 0.1 10*3/uL (ref 0.0–0.2)
Basos: 1 %
EOS (ABSOLUTE): 0.2 10*3/uL (ref 0.0–0.4)
Eos: 4 %
Hematocrit: 43.5 % (ref 37.5–51.0)
Hemoglobin: 15 g/dL (ref 13.0–17.7)
Immature Grans (Abs): 0 10*3/uL (ref 0.0–0.1)
Immature Granulocytes: 0 %
Lymphocytes Absolute: 1.3 10*3/uL (ref 0.7–3.1)
Lymphs: 21 %
MCH: 29.2 pg (ref 26.6–33.0)
MCHC: 34.5 g/dL (ref 31.5–35.7)
MCV: 85 fL (ref 79–97)
Monocytes Absolute: 0.6 10*3/uL (ref 0.1–0.9)
Monocytes: 10 %
Neutrophils Absolute: 4.1 10*3/uL (ref 1.4–7.0)
Neutrophils: 64 %
Platelets: 272 10*3/uL (ref 150–450)
RBC: 5.13 x10E6/uL (ref 4.14–5.80)
RDW: 13.7 % (ref 11.6–15.4)
WBC: 6.4 10*3/uL (ref 3.4–10.8)

## 2018-12-21 LAB — LIPID PANEL WITH LDL/HDL RATIO
Cholesterol, Total: 214 mg/dL — ABNORMAL HIGH (ref 100–199)
HDL: 33 mg/dL — ABNORMAL LOW (ref >39)
LDL Calculated: 110 mg/dL — ABNORMAL HIGH (ref 0–99)
LDl/HDL Ratio: 3.3 ratio (ref 0.0–3.6)
Triglycerides: 355 mg/dL — ABNORMAL HIGH (ref 0–149)
VLDL Cholesterol Cal: 71 mg/dL — ABNORMAL HIGH (ref 5–40)

## 2018-12-21 LAB — IRON AND TIBC
Iron Saturation: 36 % (ref 15–55)
Iron: 107 ug/dL (ref 38–169)
Total Iron Binding Capacity: 296 ug/dL (ref 250–450)
UIBC: 189 ug/dL (ref 111–343)

## 2018-12-21 LAB — TESTOSTERONE: Testosterone: 352 ng/dL (ref 264–916)

## 2018-12-21 LAB — PROLACTIN: Prolactin: 6.6 ng/mL (ref 4.0–15.2)

## 2018-12-21 LAB — PSA: Prostate Specific Ag, Serum: 1.4 ng/mL (ref 0.0–4.0)

## 2018-12-21 LAB — VITAMIN D 25 HYDROXY (VIT D DEFICIENCY, FRACTURES): Vit D, 25-Hydroxy: 20.8 ng/mL — ABNORMAL LOW (ref 30.0–100.0)

## 2018-12-21 LAB — T4, FREE: Free T4: 1.4 ng/dL (ref 0.82–1.77)

## 2018-12-25 ENCOUNTER — Encounter: Payer: Self-pay | Admitting: Adult Health

## 2018-12-29 ENCOUNTER — Other Ambulatory Visit: Payer: 59

## 2019-01-03 ENCOUNTER — Encounter: Payer: 59 | Admitting: Adult Health

## 2019-01-24 ENCOUNTER — Ambulatory Visit: Payer: 59 | Admitting: Adult Health

## 2019-01-24 ENCOUNTER — Other Ambulatory Visit: Payer: Self-pay

## 2019-01-24 ENCOUNTER — Encounter: Payer: Self-pay | Admitting: Adult Health

## 2019-01-24 VITALS — BP 146/98 | HR 78 | Resp 16 | Ht 75.0 in | Wt 222.0 lb

## 2019-01-24 DIAGNOSIS — I10 Essential (primary) hypertension: Secondary | ICD-10-CM | POA: Diagnosis not present

## 2019-01-24 DIAGNOSIS — E785 Hyperlipidemia, unspecified: Secondary | ICD-10-CM

## 2019-01-24 DIAGNOSIS — Z0001 Encounter for general adult medical examination with abnormal findings: Secondary | ICD-10-CM

## 2019-01-24 DIAGNOSIS — N529 Male erectile dysfunction, unspecified: Secondary | ICD-10-CM

## 2019-01-24 DIAGNOSIS — R300 Dysuria: Secondary | ICD-10-CM

## 2019-01-24 DIAGNOSIS — K219 Gastro-esophageal reflux disease without esophagitis: Secondary | ICD-10-CM

## 2019-01-24 DIAGNOSIS — I509 Heart failure, unspecified: Secondary | ICD-10-CM

## 2019-01-24 MED ORDER — SILDENAFIL CITRATE 100 MG PO TABS: 50.0000 mg | ORAL_TABLET | Freq: Every day | ORAL | 1 refills | Status: DC | PRN

## 2019-01-24 MED ORDER — LOSARTAN POTASSIUM 100 MG PO TABS: 100.0000 mg | ORAL_TABLET | Freq: Every day | ORAL | 2 refills | Status: DC

## 2019-01-24 NOTE — Progress Notes (Signed)
[facility] [address] [place], [place] [address]  Internal MEDICINE  Office Visit Note  Patient Name: Matthew Marquez  [id number]  [id number]  Date of Service: 02/13/2019  Chief Complaint  Patient presents with  . Medical Management of Chronic Issues  . Annual Exam  . Hypertension    bp is elevated      HPI Pt is here for routine health maintenance examination.  He is a well-appearing 48 year old Caucasian man who has a history of hypertension, CHF, COPD, GERD, hyperlipidemia and chronic headaches. Overall patient is doing well managing his diagnosis.  His bp is slightly elevated today, he reports he did take his medication today.  He does have intermittent headaches, however, when he checks his bp during the headaches it is not elevated.  He denies tobacco, alcohol or illicit drug use.   Current Medication: Outpatient Encounter Medications as of 01/24/2019  Medication Sig  . carvedilol (COREG) 25 MG tablet Take 1 tablet (25 mg total) by mouth 2 (two) times daily with a meal.  . furosemide (LASIX) 20 MG tablet Take 1 tablet (20 mg total) by mouth daily.  Marland Kitchen. ibuprofen (ADVIL,MOTRIN) 800 MG tablet Take 1 tablet (800 mg total) by mouth 3 (three) times daily as needed.  . montelukast (SINGULAIR) 10 MG tablet Take 1 tablet (10 mg total) by mouth at bedtime.  Marland Kitchen. omeprazole (PRILOSEC) 20 MG capsule Take 1 capsule (20 mg total) by mouth daily.  . [DISCONTINUED] losartan (COZAAR) 50 MG tablet Take 1 tablet (50 mg total) by mouth daily.  Marland Kitchen. ADVAIR DISKUS 250-50 MCG/DOSE AEPB INHALE 1 PUFF INTO THE LUNGS 2 TIMES A DAY (Patient not taking: Reported on 01/24/2019)  . albuterol (PROVENTIL HFA;VENTOLIN HFA) 108 (90 Base) MCG/ACT inhaler Inhale 2 puffs into the lungs every 6 (six) hours as needed for wheezing or shortness of breath. (Patient not taking: Reported on 01/24/2019)  . albuterol (PROVENTIL) (2.5 MG/3ML) 0.083% nebulizer solution Take 3 mLs (2.5 mg total) by nebulization  every 6 (six) hours as needed for wheezing or shortness of breath. (Patient not taking: Reported on 01/24/2019)  . hydrocerin (EUCERIN) CREA Apply 1 application topically 2 (two) times daily.  Marland Kitchen. losartan (COZAAR) 100 MG tablet Take 1 tablet (100 mg total) by mouth daily.  . sildenafil (VIAGRA) 100 MG tablet Take 0.5-1 tablets (50-100 mg total) by mouth daily as needed for erectile dysfunction.  . [DISCONTINUED] diphenhydrAMINE (BENADRYL) 25 MG tablet Take 1 tablet (25 mg total) by mouth every 6 (six) hours as needed. (Patient not taking: Reported on 01/24/2019)   No facility-administered encounter medications on file as of 01/24/2019.     Surgical History: Past Surgical History:  Procedure Laterality Date  . MOUTH SURGERY      Medical History: Past Medical History:  Diagnosis Date  . Acid reflux   . Asthma   . Bruising   . Cervicalgia   . Chronic headaches   . Congestive heart failure (HCC)    pt unsure if correct dx  . Hypertension   . Hypoglycemia     Family History: Family History  Problem Relation Age of Onset  . Cancer Mother   . Diabetes Maternal Grandmother   . Hypertension Maternal Grandmother       Review of Systems  Constitutional: Negative.  Negative for chills, fatigue and unexpected weight change.  HENT: Negative.  Negative for congestion, rhinorrhea, sneezing and sore throat.   Eyes: Negative for redness.  Respiratory: Negative.  Negative for cough, chest  tightness and shortness of breath.   Cardiovascular: Negative.  Negative for chest pain and palpitations.  Gastrointestinal: Negative.  Negative for abdominal pain, constipation, diarrhea, nausea and vomiting.  Endocrine: Negative.   Genitourinary: Negative.  Negative for dysuria and frequency.  Musculoskeletal: Negative.  Negative for arthralgias, back pain, joint swelling and neck pain.  Skin: Negative.  Negative for rash.  Allergic/Immunologic: Negative.   Neurological: Negative.  Negative for tremors  and numbness.  Hematological: Negative for adenopathy. Does not bruise/bleed easily.  Psychiatric/Behavioral: Negative.  Negative for behavioral problems, sleep disturbance and suicidal ideas. The patient is not nervous/anxious.      Vital Signs: BP (!) 146/98   Pulse 78   Resp 16   Ht 6\' 3"  (1.905 m)   Wt 222 lb (100.7 kg)   SpO2 98%   BMI 27.75 kg/m    Physical Exam Vitals signs and nursing note reviewed.  Constitutional:      General: He is not in acute distress.    Appearance: He is well-developed. He is not diaphoretic.  HENT:     Head: Normocephalic and atraumatic.     Mouth/Throat:     Pharynx: No oropharyngeal exudate.  Eyes:     Pupils: Pupils are equal, round, and reactive to light.  Neck:     Musculoskeletal: Normal range of motion and neck supple.     Thyroid: No thyromegaly.     Vascular: No JVD.     Trachea: No tracheal deviation.  Cardiovascular:     Rate and Rhythm: Normal rate and regular rhythm.     Heart sounds: Normal heart sounds. No murmur. No friction rub. No gallop.   Pulmonary:     Effort: Pulmonary effort is normal. No respiratory distress.     Breath sounds: Normal breath sounds. No wheezing or rales.  Chest:     Chest wall: No tenderness.  Abdominal:     Palpations: Abdomen is soft.     Tenderness: There is no abdominal tenderness. There is no guarding.  Musculoskeletal: Normal range of motion.  Lymphadenopathy:     Cervical: No cervical adenopathy.  Skin:    General: Skin is warm and dry.  Neurological:     Mental Status: He is alert and oriented to person, place, and time.     Cranial Nerves: No cranial nerve deficit.  Psychiatric:        Behavior: Behavior normal.        Thought Content: Thought content normal.        Judgment: Judgment normal.      LABS: Recent Results (from the past 2160 hour(s))  CBC with Differential/Platelet     Status: None   Collection Time: 12/20/18 11:47 AM  Result Value Ref Range   WBC 6.4 3.4 -  10.8 x10E3/uL   RBC 5.13 4.14 - 5.80 x10E6/uL   Hemoglobin 15.0 13.0 - 17.7 g/dL   Hematocrit 29.543.5 62.137.5 - 51.0 %   MCV 85 79 - 97 fL   MCH 29.2 26.6 - 33.0 pg   MCHC 34.5 31.5 - 35.7 g/dL   RDW 30.813.7 65.711.6 - 84.615.4 %   Platelets 272 150 - 450 x10E3/uL   Neutrophils 64 Not Estab. %   Lymphs 21 Not Estab. %   Monocytes 10 Not Estab. %   Eos 4 Not Estab. %   Basos 1 Not Estab. %   Neutrophils Absolute 4.1 1.4 - 7.0 x10E3/uL   Lymphocytes Absolute 1.3 0.7 - 3.1 x10E3/uL   Monocytes  Absolute 0.6 0.1 - 0.9 x10E3/uL   EOS (ABSOLUTE) 0.2 0.0 - 0.4 x10E3/uL   Basophils Absolute 0.1 0.0 - 0.2 x10E3/uL   Immature Granulocytes 0 Not Estab. %   Immature Grans (Abs) 0.0 0.0 - 0.1 x10E3/uL  Comprehensive metabolic panel     Status: Abnormal   Collection Time: 12/20/18 11:47 AM  Result Value Ref Range   Glucose 124 (H) 65 - 99 mg/dL   BUN 21 6 - 24 mg/dL   Creatinine, Ser 1.61 0.76 - 1.27 mg/dL   GFR calc non Af Amer 69 >59 mL/min/1.73   GFR calc Af Amer 79 >59 mL/min/1.73   BUN/Creatinine Ratio 17 9 - 20   Sodium 140 134 - 144 mmol/L   Potassium 4.1 3.5 - 5.2 mmol/L   Chloride 104 96 - 106 mmol/L   CO2 24 20 - 29 mmol/L   Calcium 9.4 8.7 - 10.2 mg/dL   Total Protein 6.9 6.0 - 8.5 g/dL   Albumin 4.4 4.0 - 5.0 g/dL   Globulin, Total 2.5 1.5 - 4.5 g/dL   Albumin/Globulin Ratio 1.8 1.2 - 2.2   Bilirubin Total 0.3 0.0 - 1.2 mg/dL   Alkaline Phosphatase 80 39 - 117 IU/L   AST 15 0 - 40 IU/L   ALT 20 0 - 44 IU/L  Lipid Panel With LDL/HDL Ratio     Status: Abnormal   Collection Time: 12/20/18 11:47 AM  Result Value Ref Range   Cholesterol, Total 214 (H) 100 - 199 mg/dL   Triglycerides 096 (H) 0 - 149 mg/dL   HDL 33 (L) >04 mg/dL   VLDL Cholesterol Cal 71 (H) 5 - 40 mg/dL   LDL Calculated 540 (H) 0 - 99 mg/dL   LDl/HDL Ratio 3.3 0.0 - 3.6 ratio    Comment:                                     LDL/HDL Ratio                                             Men  Women                                1/2 Avg.Risk  1.0    1.5                                   Avg.Risk  3.6    3.2                                2X Avg.Risk  6.2    5.0                                3X Avg.Risk  8.0    6.1   Iron and TIBC     Status: None   Collection Time: 12/20/18 11:47 AM  Result Value Ref Range   Total Iron Binding Capacity 296 250 - 450 ug/dL   UIBC 981 191 - 478 ug/dL   Iron 295 38 -  169 ug/dL   Iron Saturation 36 15 - 55 %  T4, free     Status: None   Collection Time: 12/20/18 11:47 AM  Result Value Ref Range   Free T4 1.40 0.82 - 1.77 ng/dL  Testosterone     Status: None   Collection Time: 12/20/18 11:47 AM  Result Value Ref Range   Testosterone 352 264 - 916 ng/dL    Comment: Adult male reference interval is based on a population of healthy nonobese males (BMI <30) between 61 and 25 years old. [place], [place] [phone]. PMID: [id number].   TSH     Status: None   Collection Time: 12/20/18 11:47 AM  Result Value Ref Range   TSH 0.927 0.450 - 4.500 uIU/mL  Prolactin     Status: None   Collection Time: 12/20/18 11:47 AM  Result Value Ref Range   Prolactin 6.6 4.0 - 15.2 ng/mL  PSA     Status: None   Collection Time: 12/20/18 11:47 AM  Result Value Ref Range   Prostate Specific Ag, Serum 1.4 0.0 - 4.0 ng/mL    Comment: Roche ECLIA methodology. According to the American Urological Association, Serum PSA should decrease and remain at undetectable levels after radical prostatectomy. The AUA defines biochemical recurrence as an initial PSA value 0.2 ng/mL or greater followed by a subsequent confirmatory PSA value 0.2 ng/mL or greater. Values obtained with different assay methods or kits cannot be used interchangeably. Results cannot be interpreted as absolute evidence of the presence or absence of malignant disease.   VITAMIN D 25 Hydroxy (Vit-D Deficiency, Fractures)     Status: Abnormal   Collection Time: 12/20/18 11:47 AM  Result Value Ref Range   Vit D, 25-Hydroxy  20.8 (L) 30.0 - 100.0 ng/mL    Comment: Vitamin D deficiency has been defined by the [place] practice guideline as a level of serum 25-OH vitamin D less than 20 ng/mL (1,2). The Endocrine Society went on to further define vitamin D insufficiency as a level between 21 and 29 ng/mL (2). 1. IOM (Institute of Medicine). 2010. Dietary reference    intakes for calcium and D. [place]: The    [employer]. 2. Holick MF, Binkley , Bischoff-Ferrari HA, et al.    Evaluation, treatment, and prevention of vitamin D    deficiency: an Endocrine Society clinical practice    guideline. JCEM. 2011 Jul; 96(7):1911-30.   Ferritin     Status: None   Collection Time: 12/20/18 11:47 AM  Result Value Ref Range   Ferritin 105 30 - 400 ng/mL  UA/M w/rflx Culture, Routine     Status: None   Collection Time: 01/24/19 10:07 AM   Specimen: Urine   URINE  Result Value Ref Range   Specific Gravity, UA 1.021 1.005 - 1.030   pH, UA 6.0 5.0 - 7.5   Color, UA Yellow Yellow   Appearance Ur Clear Clear   Leukocytes,UA Negative Negative   Protein,UA Trace Negative/Trace   Glucose, UA Negative Negative   Ketones, UA Negative Negative   RBC, UA Negative Negative   Bilirubin, UA Negative Negative   Urobilinogen, Ur 0.2 0.2 - 1.0 mg/dL   Nitrite, UA Negative Negative   Microscopic Examination Comment     Comment: Microscopic follows if indicated.   Microscopic Examination See below:     Comment: Microscopic was indicated and was performed.   Urinalysis Reflex Comment     Comment: This specimen will  not reflex to a Urine Culture.  Microscopic Examination     Status: None   Collection Time: 01/24/19 10:07 AM   URINE  Result Value Ref Range   WBC, UA 0-5 0 - 5 /hpf   RBC 0-2 0 - 2 /hpf   Epithelial Cells (non renal) None seen 0 - 10 /hpf   Casts None seen None seen /lpf   Mucus, UA Present Not Estab.   Bacteria, UA Few None seen/Few      Assessment/Plan: 1. Encounter for general adult medical examination with abnormal findings Up to date on PHM.    2. Hypertension, unspecified type Increased patients cozaar to 100mg  daily. Hypertension Counseling:   The following hypertensive lifestyle modification were recommended and discussed:  1. Limiting alcohol intake to less than 1 oz/day of ethanol:(24 oz of beer or 8 oz of wine or 2 oz of 100-proof whiskey). 2. Take baby ASA 81 mg daily. 3. Importance of regular aerobic exercise and losing weight. 4. Reduce dietary saturated fat and cholesterol intake for overall cardiovascular health. 5. Maintaining adequate dietary potassium, calcium, and magnesium intake. 6. Regular monitoring of the blood pressure. 7. Reduce sodium intake to less than 100 mmol/day (less than 2.3 gm of sodium or less than 6 gm of sodium choride)    - losartan (COZAAR) 100 MG tablet; Take 1 tablet (100 mg total) by mouth daily.  Dispense: 90 tablet; Refill: 2  3. Erectile dysfunction, unspecified erectile dysfunction type Discussed cautions with using viagra.  Refilled medications at this time.  - sildenafil (VIAGRA) 100 MG tablet; Take 0.5-1 tablets (50-100 mg total) by mouth daily as needed for erectile dysfunction.  Dispense: 30 tablet; Refill: 1  4. Dysuria - UA/M w/rflx Culture, Routine - Microscopic Examination  5. Gastroesophageal reflux disease without esophagitis Continue omeprazole as directed.   6. Congestive heart failure, unspecified HF chronicity, unspecified heart failure type (HCC) Stable, continue to follow up with cardiology as needed/scheduled.  7. Hyperlipidemia, unspecified hyperlipidemia type Continue present management.  Offered patient statin therapy, but he is unable to take medications.   General Counseling: Matthew Marquez verbalizes understanding of the findings of todays visit and agrees with plan of treatment. I have discussed any further diagnostic evaluation that may be  needed or ordered today. We also reviewed his medications today. he has been encouraged to call the office with any questions or concerns that should arise related to todays visit.   Orders Placed This Encounter  Procedures  . Microscopic Examination  . UA/M w/rflx Culture, Routine    Meds ordered this encounter  Medications  . losartan (COZAAR) 100 MG tablet    Sig: Take 1 tablet (100 mg total) by mouth daily.    Dispense:  90 tablet    Refill:  2  . sildenafil (VIAGRA) 100 MG tablet    Sig: Take 0.5-1 tablets (50-100 mg total) by mouth daily as needed for erectile dysfunction.    Dispense:  30 tablet    Refill:  1    Has good rx cards    Time spent: 20 Minutes   This patient was seen by Blima LedgerAdam Keya Wynes AGNP-C in Collaboration with Dr Lyndon CodeFozia M Khan as a part of collaborative care agreement    Johnna AcostaAdam J. Liberato Stansbery AGNP-C Internal Medicine

## 2019-01-25 LAB — UA/M W/RFLX CULTURE, ROUTINE
Bilirubin, UA: NEGATIVE
Glucose, UA: NEGATIVE
Ketones, UA: NEGATIVE
Leukocytes,UA: NEGATIVE
Nitrite, UA: NEGATIVE
RBC, UA: NEGATIVE
Specific Gravity, UA: 1.021 (ref 1.005–1.030)
Urobilinogen, Ur: 0.2 mg/dL (ref 0.2–1.0)
pH, UA: 6 (ref 5.0–7.5)

## 2019-01-25 LAB — MICROSCOPIC EXAMINATION
Casts: NONE SEEN /lpf
Epithelial Cells (non renal): NONE SEEN /hpf (ref 0–10)

## 2019-02-21 ENCOUNTER — Ambulatory Visit: Payer: Self-pay | Admitting: Adult Health

## 2019-02-28 ENCOUNTER — Ambulatory Visit: Payer: 59 | Admitting: Adult Health

## 2019-02-28 ENCOUNTER — Encounter: Payer: Self-pay | Admitting: Adult Health

## 2019-02-28 ENCOUNTER — Other Ambulatory Visit: Payer: Self-pay

## 2019-02-28 VITALS — BP 138/90 | HR 68 | Resp 16 | Ht 75.0 in | Wt 222.0 lb

## 2019-02-28 DIAGNOSIS — K219 Gastro-esophageal reflux disease without esophagitis: Secondary | ICD-10-CM

## 2019-02-28 DIAGNOSIS — N529 Male erectile dysfunction, unspecified: Secondary | ICD-10-CM | POA: Diagnosis not present

## 2019-02-28 DIAGNOSIS — I10 Essential (primary) hypertension: Secondary | ICD-10-CM

## 2019-02-28 MED ORDER — CARVEDILOL 25 MG PO TABS: 25.0000 mg | ORAL_TABLET | Freq: Two times a day (BID) | ORAL | 2 refills | Status: DC

## 2019-02-28 MED ORDER — SILDENAFIL CITRATE 100 MG PO TABS: 50.0000 mg | ORAL_TABLET | Freq: Every day | ORAL | 1 refills | Status: DC | PRN

## 2019-02-28 MED ORDER — FUROSEMIDE 20 MG PO TABS: 20.0000 mg | ORAL_TABLET | Freq: Every day | ORAL | 2 refills | Status: DC

## 2019-02-28 MED ORDER — MELOXICAM 15 MG PO TABS: 15.0000 mg | ORAL_TABLET | Freq: Every day | ORAL | 0 refills | Status: DC

## 2019-02-28 NOTE — Progress Notes (Signed)
[facility] [place], [place] [address]  Internal MEDICINE  Office Visit Note  Patient Name: Matthew Marquez  [id number]  [id number]  Date of Service: 02/28/2019  Chief Complaint  Patient presents with  . Medical Management of Chronic Issues    4 week follow up  . Hypertension    HPI Pt is here for follow up on HTN. His bp is improved today  138/90.  He is taking 100 mg of losartan. And 25 mg of coreg twice daily. He denies any side effects of medications.        Current Medication: Outpatient Encounter Medications as of 02/28/2019  Medication Sig  . carvedilol (COREG) 25 MG tablet Take 1 tablet (25 mg total) by mouth 2 (two) times daily with a meal.  . furosemide (LASIX) 20 MG tablet Take 1 tablet (20 mg total) by mouth daily.  . hydrocerin (EUCERIN) CREA Apply 1 application topically 2 (two) times daily.  Marland Kitchen losartan (COZAAR) 100 MG tablet Take 1 tablet (100 mg total) by mouth daily.  . montelukast (SINGULAIR) 10 MG tablet Take 1 tablet (10 mg total) by mouth at bedtime.  Marland Kitchen omeprazole (PRILOSEC) 20 MG capsule Take 1 capsule (20 mg total) by mouth daily.  . sildenafil (VIAGRA) 100 MG tablet Take 0.5-1 tablets (50-100 mg total) by mouth daily as needed for erectile dysfunction.  . [DISCONTINUED] carvedilol (COREG) 25 MG tablet Take 1 tablet (25 mg total) by mouth 2 (two) times daily with a meal.  . [DISCONTINUED] furosemide (LASIX) 20 MG tablet Take 1 tablet (20 mg total) by mouth daily.  . [DISCONTINUED] ibuprofen (ADVIL,MOTRIN) 800 MG tablet Take 1 tablet (800 mg total) by mouth 3 (three) times daily as needed.  . [DISCONTINUED] sildenafil (VIAGRA) 100 MG tablet Take 0.5-1 tablets (50-100 mg total) by mouth daily as needed for erectile dysfunction.  . meloxicam (MOBIC) 15 MG tablet Take 1 tablet (15 mg total) by mouth daily.  . [DISCONTINUED] ADVAIR DISKUS 250-50 MCG/DOSE AEPB INHALE 1 PUFF INTO THE LUNGS 2 TIMES A DAY (Patient not taking: Reported on  01/24/2019)  . [DISCONTINUED] albuterol (PROVENTIL HFA;VENTOLIN HFA) 108 (90 Base) MCG/ACT inhaler Inhale 2 puffs into the lungs every 6 (six) hours as needed for wheezing or shortness of breath. (Patient not taking: Reported on 01/24/2019)  . [DISCONTINUED] albuterol (PROVENTIL) (2.5 MG/3ML) 0.083% nebulizer solution Take 3 mLs (2.5 mg total) by nebulization every 6 (six) hours as needed for wheezing or shortness of breath. (Patient not taking: Reported on 01/24/2019)   No facility-administered encounter medications on file as of 02/28/2019.     Surgical History: Past Surgical History:  Procedure Laterality Date  . MOUTH SURGERY      Medical History: Past Medical History:  Diagnosis Date  . Acid reflux   . Asthma   . Bruising   . Cervicalgia   . Chronic headaches   . Congestive heart failure ([place])    pt unsure if correct dx  . Hypertension   . Hypoglycemia     Family History: Family History  Problem Relation Age of Onset  . Cancer Mother   . Diabetes Maternal Grandmother   . Hypertension Maternal Grandmother     Social History   Socioeconomic History  . Marital status: Legally Separated    Spouse name: Not on file  . Number of children: 3  . Years of education: 69  . Highest education level: GED or equivalent  Occupational History  . Occupation: unemployed  Social  Needs  . Financial resource strain: Somewhat hard  . Food insecurity    Worry: Often true    Inability: Often true  . Transportation needs    Medical: Yes    Non-medical: Yes  Tobacco Use  . Smoking status: Former Smoker    Quit date: 2015    Years since quitting: 5.5  . Smokeless tobacco: Never Used  Substance and Sexual Activity  . Alcohol use: Yes    Alcohol/week: 1.0 standard drinks    Types: 1 Cans of beer per week    Comment: Socially / Occasionally   . Drug use: Never  . Sexual activity: Not Currently  Lifestyle  . Physical activity    Days per week: Not on file    Minutes per session:  Not on file  . Stress: Not on file  Relationships  . Social Musicianconnections    Talks on phone: Not on file    Gets together: Not on file    Attends religious service: Not on file    Active member of club or organization: Not on file    Attends meetings of clubs or organizations: Not on file    Relationship status: Not on file  . Intimate partner violence    Fear of current or ex partner: No    Emotionally abused: No    Physically abused: No    Forced sexual activity: No  Other Topics Concern  . Not on file  Social History Narrative   Pt owns home but there is no power or water. He gets water and takes showers from his brothers house. Cooks on a grill. Right now he drives his brothers motorcycle. States he enjoys "living off the grid". Pt is single   Was on food stamps but can't reapply now. Also mentioned he is currently unemployed but taking his final GED test and looking for a job      Review of Systems  Constitutional: Negative.  Negative for chills, fatigue and unexpected weight change.  HENT: Negative.  Negative for congestion, rhinorrhea, sneezing and sore throat.   Eyes: Negative for redness.  Respiratory: Negative.  Negative for cough, chest tightness and shortness of breath.   Cardiovascular: Negative.  Negative for chest pain and palpitations.  Gastrointestinal: Negative.  Negative for abdominal pain, constipation, diarrhea, nausea and vomiting.  Endocrine: Negative.   Genitourinary: Negative.  Negative for dysuria and frequency.  Musculoskeletal: Negative.  Negative for arthralgias, back pain, joint swelling and neck pain.  Skin: Negative.  Negative for rash.  Allergic/Immunologic: Negative.   Neurological: Negative.  Negative for tremors and numbness.  Hematological: Negative for adenopathy. Does not bruise/bleed easily.  Psychiatric/Behavioral: Negative.  Negative for behavioral problems, sleep disturbance and suicidal ideas. The patient is not nervous/anxious.      Vital Signs: BP 138/90   Pulse 68   Resp 16   Ht 6\' 3"  (1.905 m)   Wt 222 lb (100.7 kg)   SpO2 96%   BMI 27.75 kg/m    Physical Exam Vitals signs and nursing note reviewed.  Constitutional:      General: He is not in acute distress.    Appearance: He is well-developed. He is not diaphoretic.  HENT:     Head: Normocephalic and atraumatic.     Mouth/Throat:     Pharynx: No oropharyngeal exudate.  Eyes:     Pupils: Pupils are equal, round, and reactive to light.  Neck:     Musculoskeletal: Normal range of motion and neck  supple.     Thyroid: No thyromegaly.     Vascular: No JVD.     Trachea: No tracheal deviation.  Cardiovascular:     Rate and Rhythm: Normal rate and regular rhythm.     Heart sounds: Normal heart sounds. No murmur. No friction rub. No gallop.   Pulmonary:     Effort: Pulmonary effort is normal. No respiratory distress.     Breath sounds: Normal breath sounds. No wheezing or rales.  Chest:     Chest wall: No tenderness.  Abdominal:     Palpations: Abdomen is soft.     Tenderness: There is no abdominal tenderness. There is no guarding.  Musculoskeletal: Normal range of motion.  Lymphadenopathy:     Cervical: No cervical adenopathy.  Skin:    General: Skin is warm and dry.  Neurological:     Mental Status: He is alert and oriented to person, place, and time.     Cranial Nerves: No cranial nerve deficit.  Psychiatric:        Behavior: Behavior normal.        Thought Content: Thought content normal.        Judgment: Judgment normal.    Assessment/Plan: 1. Hypertension, unspecified type Refilled patients medications, continue as directed.  - furosemide (LASIX) 20 MG tablet; Take 1 tablet (20 mg total) by mouth daily.  Dispense: 90 tablet; Refill: 2 - carvedilol (COREG) 25 MG tablet; Take 1 tablet (25 mg total) by mouth 2 (two) times daily with a meal.  Dispense: 180 tablet; Refill: 2  2. Gastroesophageal reflux disease without  esophagitis Stable, continue present management.   3. Erectile dysfunction, unspecified erectile dysfunction type Refilled patients Viagra, discussed, not taking if BP is elevated.  - sildenafil (VIAGRA) 100 MG tablet; Take 0.5-1 tablets (50-100 mg total) by mouth daily as needed for erectile dysfunction.  Dispense: 30 tablet; Refill: 1  General Counseling: Tinnie GensJeffrey verbalizes understanding of the findings of todays visit and agrees with plan of treatment. I have discussed any further diagnostic evaluation that may be needed or ordered today. We also reviewed his medications today. he has been encouraged to call the office with any questions or concerns that should arise related to todays visit.    No orders of the defined types were placed in this encounter.   Meds ordered this encounter  Medications  . sildenafil (VIAGRA) 100 MG tablet    Sig: Take 0.5-1 tablets (50-100 mg total) by mouth daily as needed for erectile dysfunction.    Dispense:  30 tablet    Refill:  1    Has good rx cards  . meloxicam (MOBIC) 15 MG tablet    Sig: Take 1 tablet (15 mg total) by mouth daily.    Dispense:  30 tablet    Refill:  0  . furosemide (LASIX) 20 MG tablet    Sig: Take 1 tablet (20 mg total) by mouth daily.    Dispense:  90 tablet    Refill:  2  . carvedilol (COREG) 25 MG tablet    Sig: Take 1 tablet (25 mg total) by mouth 2 (two) times daily with a meal.    Dispense:  180 tablet    Refill:  2    Time spent: 25 Minutes   This patient was seen by Blima LedgerAdam Salar Molden AGNP-C in Collaboration with Dr Lyndon CodeFozia M Khan as a part of collaborative care agreement     Johnna AcostaAdam J. Alexys Gassett AGNP-C Internal medicine

## 2019-03-27 ENCOUNTER — Other Ambulatory Visit: Payer: Self-pay | Admitting: Adult Health

## 2019-05-31 ENCOUNTER — Ambulatory Visit: Payer: 59 | Admitting: Adult Health

## 2019-06-01 ENCOUNTER — Telehealth: Payer: Self-pay

## 2019-06-01 NOTE — Telephone Encounter (Signed)
LMOM FOR PATIENT TO CALL AND RS MISSED APPT.

## 2019-06-18 IMAGING — CR DG CHEST 2V
2 series · 2 of 2 positions shown · non-contrast
Comparison: None

CLINICAL DATA: Shortness of breath for 2 weeks increased recently,
history CHF, hypertension, former smoker

EXAM:
CHEST - 2 VIEW

[chest pa]
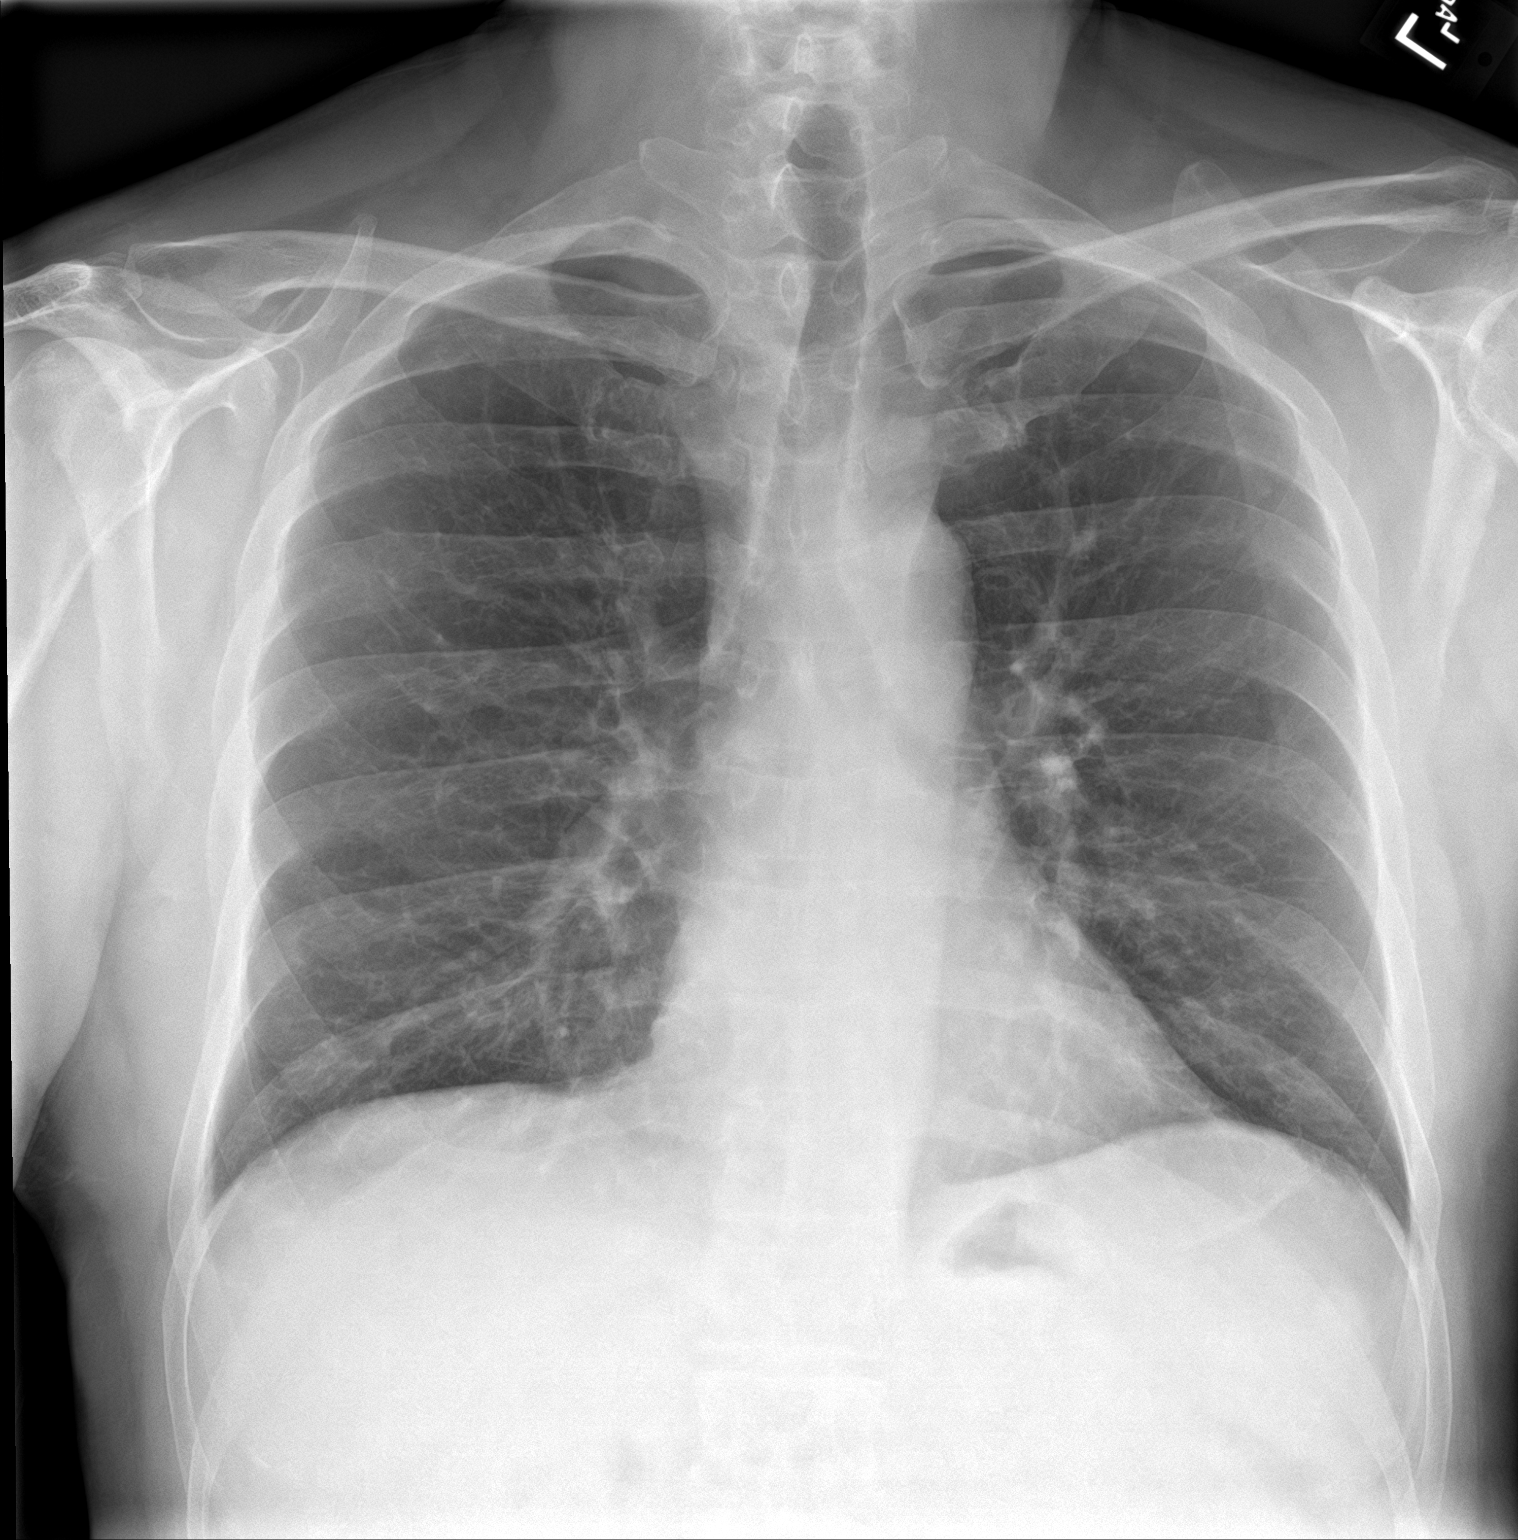

[chest lat]
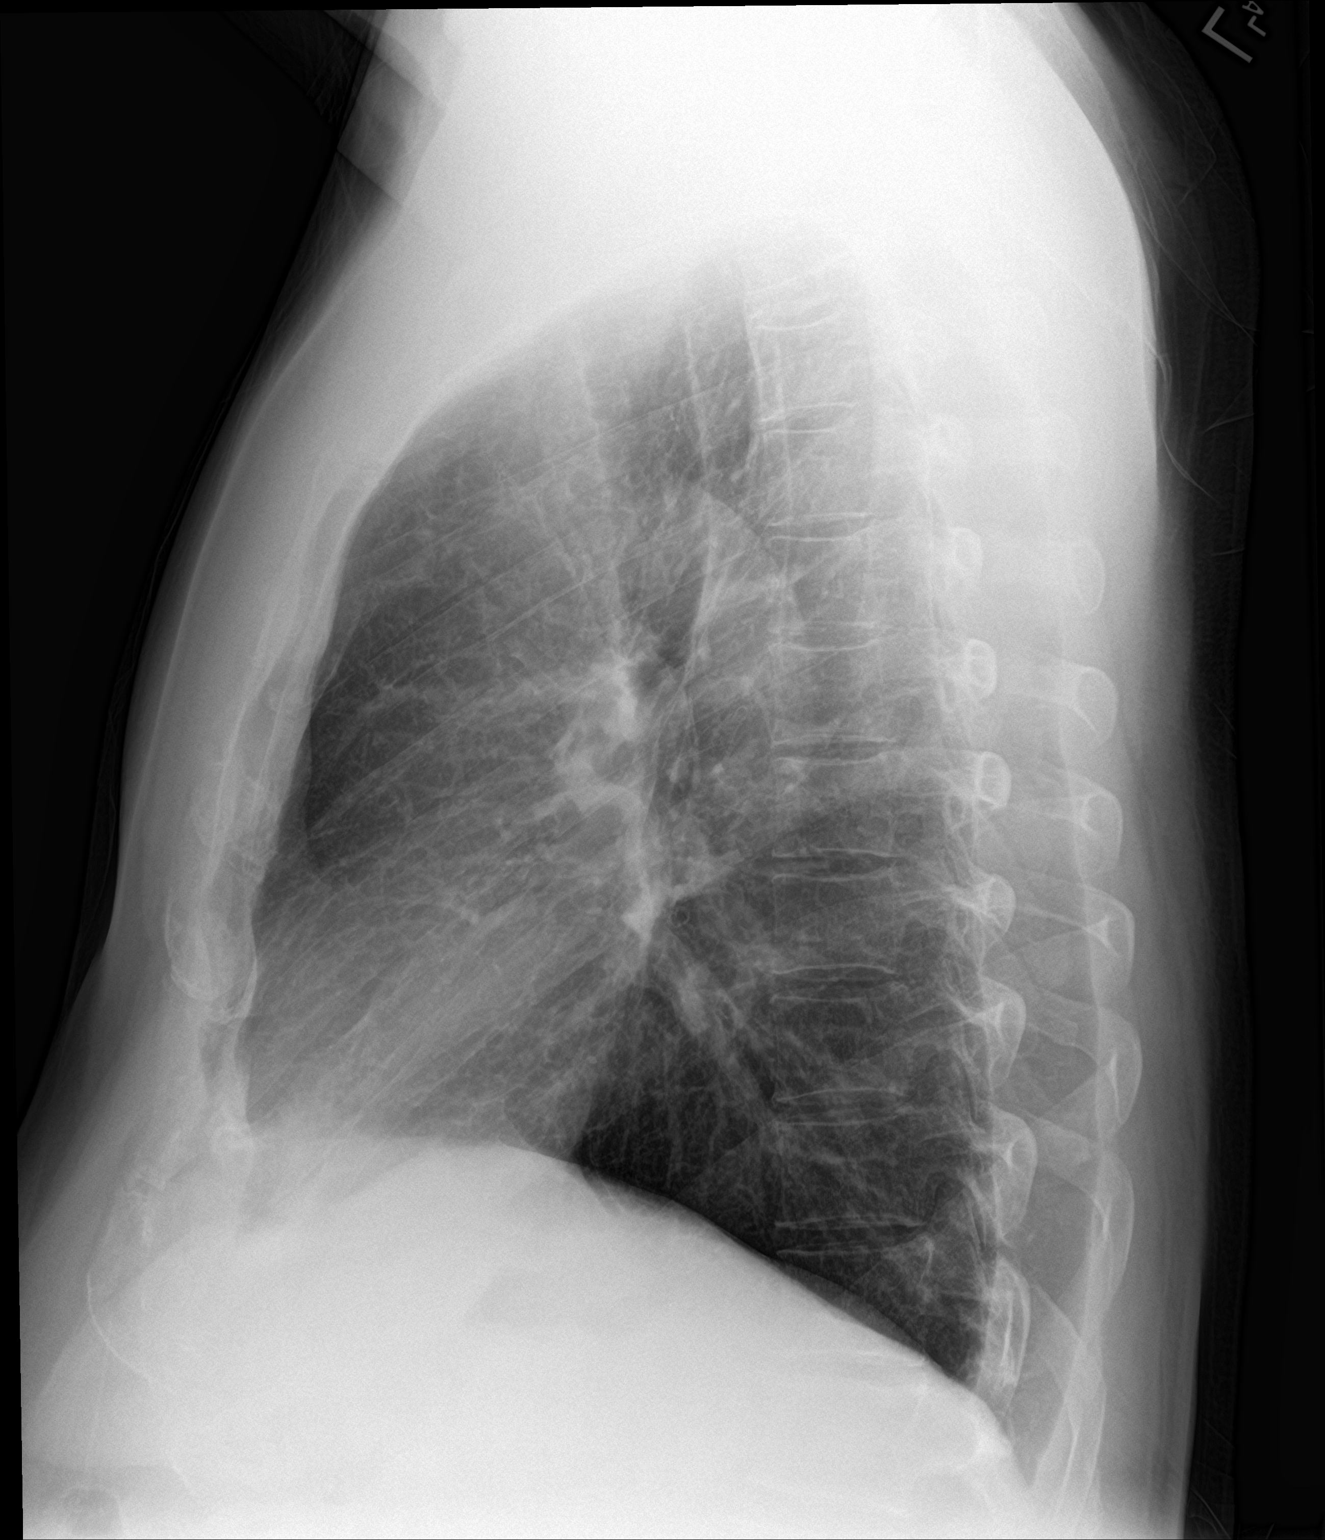

[2 of 2 positions shown; findings below may reference images not displayed]

FINDINGS: Normal heart size and pulmonary vascularity.

Slight mass effect upon the RIGHT lateral aspect of the inferior
cervical trachea question due to enlarged RIGHT thyroid lobe/thyroid
mass.

Lungs clear.

No pleural effusion or pneumothorax.

Bones unremarkable.
IMPRESSION: Mild mass effect upon the RIGHT lateral aspect of the inferior
cervical trachea question due to RIGHT thyroid enlargement/mass;
recommend correlation with physical exam and consider thyroid
ultrasound.

No acute abnormalities.

## 2021-01-07 ENCOUNTER — Other Ambulatory Visit: Payer: Self-pay

## 2021-01-07 ENCOUNTER — Ambulatory Visit: Payer: Medicaid Other | Admitting: Gerontology

## 2021-01-07 ENCOUNTER — Encounter: Payer: Self-pay | Admitting: Gerontology

## 2021-01-07 VITALS — BP 138/88 | HR 70 | Temp 97.7°F | Resp 16 | Ht 72.5 in | Wt 210.2 lb

## 2021-01-07 DIAGNOSIS — N529 Male erectile dysfunction, unspecified: Secondary | ICD-10-CM | POA: Insufficient documentation

## 2021-01-07 DIAGNOSIS — K219 Gastro-esophageal reflux disease without esophagitis: Secondary | ICD-10-CM

## 2021-01-07 DIAGNOSIS — Z7689 Persons encountering health services in other specified circumstances: Secondary | ICD-10-CM | POA: Insufficient documentation

## 2021-01-07 DIAGNOSIS — I509 Heart failure, unspecified: Secondary | ICD-10-CM

## 2021-01-07 DIAGNOSIS — I10 Essential (primary) hypertension: Secondary | ICD-10-CM

## 2021-01-07 DIAGNOSIS — K0889 Other specified disorders of teeth and supporting structures: Secondary | ICD-10-CM | POA: Insufficient documentation

## 2021-01-07 DIAGNOSIS — M542 Cervicalgia: Secondary | ICD-10-CM

## 2021-01-07 DIAGNOSIS — E782 Mixed hyperlipidemia: Secondary | ICD-10-CM

## 2021-01-07 DIAGNOSIS — Z8709 Personal history of other diseases of the respiratory system: Secondary | ICD-10-CM | POA: Insufficient documentation

## 2021-01-07 DIAGNOSIS — G8929 Other chronic pain: Secondary | ICD-10-CM | POA: Insufficient documentation

## 2021-01-07 MED ORDER — OMEPRAZOLE 20 MG PO CPDR
20.0000 mg | DELAYED_RELEASE_CAPSULE | Freq: Every day | ORAL | 2 refills | Status: DC
Start: 2021-01-07 — End: 2021-11-10
  Filled 2021-01-07: qty 30, 30d supply, fill #0
  Filled 2021-02-03: qty 30, 30d supply, fill #1
  Filled 2021-03-02: qty 30, 30d supply, fill #2
  Filled 2021-04-01: qty 30, 30d supply, fill #3

## 2021-01-07 MED ORDER — CHLORTHALIDONE 25 MG PO TABS
25.0000 mg | ORAL_TABLET | Freq: Every day | ORAL | 2 refills | Status: DC
  Filled 2021-01-07: qty 30, 30d supply, fill #0
  Filled 2021-02-03: qty 30, 30d supply, fill #1

## 2021-01-07 MED ORDER — ALBUTEROL SULFATE HFA 108 (90 BASE) MCG/ACT IN AERS
2.0000 | INHALATION_SPRAY | Freq: Four times a day (QID) | RESPIRATORY_TRACT | 2 refills | Status: DC | PRN
  Filled 2021-01-07: qty 6.7, 25d supply, fill #0

## 2021-01-07 MED ORDER — CARVEDILOL 6.25 MG PO TABS
6.2500 mg | ORAL_TABLET | Freq: Two times a day (BID) | ORAL | 2 refills | Status: DC
  Filled 2021-01-07: qty 60, 30d supply, fill #0
  Filled 2021-02-03: qty 60, 30d supply, fill #1

## 2021-01-07 MED ORDER — SUMATRIPTAN SUCCINATE 50 MG PO TABS
50.0000 mg | ORAL_TABLET | Freq: Once | ORAL | 0 refills | Status: DC
  Filled 2021-01-07: qty 9, 30d supply, fill #0

## 2021-01-07 MED ORDER — ATORVASTATIN CALCIUM 20 MG PO TABS
20.0000 mg | ORAL_TABLET | Freq: Every day | ORAL | 2 refills | Status: DC
  Filled 2021-01-07: qty 30, 30d supply, fill #0
  Filled 2021-02-03: qty 30, 30d supply, fill #1

## 2021-01-07 NOTE — Patient Instructions (Signed)

## 2021-01-07 NOTE — Progress Notes (Signed)
New Patient Office Visit  Subjective:  Patient ID: Matthew Marquez, male    DOB: 1971-04-18  Age: 50 y.o. MRN: [id number]  CC:  Chief Complaint  Patient presents with  . Establish Care  . Hypertension  . Congestive Heart Failure    HPI Matthew Marquez is a 50 y/o male who has history of Acid reflux, Asthma, Congestive Heart Failure, Hypertension presents to establish care and evaluation of his chronic conditions. He has a history of Hypertension and takes 25 mg Hydrochlorthiazide and 6.25 mg Coreg bid. He reports checking his blood pressure at home and adheres to DASH diet. He also has a history of CHF, admits to experiencing shortness of breath with moderate exertion, intermittent edema to lower extremities which resolves with elevating his legs, but denies fatigue and orthopnea. He also has a history of Asthma and reports that his last exacerbation was more than 30 years ago. He also reports having a history of acid reflux and takes Tum with minimal relief. He also has a history of chronic right sided headache that radiates to his entire head which is triggered with changes in weather. He states that he experiences headache when he's working outside in the hot or cold weather, and taking Advil moderately relieves symptoms. He states that headache occurs daily, and it started 4 months ago. He also reports difficulty achieving and maintaining erection that has been going on for many years and taking Sildenafil improves symptoms. Also he reports chronic ankle pain that has been going on for more than 14 years after twisting his ankle and continues to take 650 mg Tylenol bid and wears ankle brace with some relief. 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 He also reports having loose teeth to lower mandible and requests Dental referral. He reports pain with brushing his teeth, denies swelling and erythema. Overall, he states that  he's doing well and offers no further complaint.  Past Medical History:  Diagnosis Date  . Acid reflux   . Asthma   . Bruising   . Cervicalgia   . Chronic headaches   . Congestive heart failure ([place])    pt unsure if correct dx  . Hypertension   . Hypoglycemia     Past Surgical History:  Procedure Laterality Date  . MOUTH SURGERY      Family History  Problem Relation Age of Onset  . Cancer Mother   . Hyperlipidemia Mother   . Hypertension Mother   . Diabetes Maternal Grandmother   . Hypertension Maternal Grandmother   . Suicidality Father   . Edema Sister        LE  . Cancer Brother     Social History   Socioeconomic History  . Marital status: Legally Separated    Spouse name: Not on file  . Number of children: 3  . Years of education: 35  . Highest education level: GED or equivalent  Occupational History  . Occupation: unemployed  Tobacco Use  . Smoking status: Former Smoker    Quit date: 2015    Years since quitting: 7.4  . Smokeless tobacco: Never Used  Vaping Use  . Vaping Use: Never used  Substance and Sexual Activity  . Alcohol use: Not Currently    Alcohol/week: 1.0 standard drink    Types: 1 Cans of beer per week    Comment: social, last use 2010  . Drug use: Never  .  Sexual activity: Not Currently  Other Topics Concern  . Not on file  Social History Narrative   Pt owns home but there is no power or water. He gets water and takes showers from his brothers house. Cooks on a grill. Right now he drives his brothers motorcycle. States he enjoys "living off the grid". Pt is single   Was on food stamps but can't reapply now. Also mentioned he is currently unemployed but taking his final GED test and looking for a job   Social Determinants of Radio broadcast assistant Strain: Not on file  Food Insecurity: No Food Insecurity  . Worried About Charity fundraiser in the Last Year: Never true  . Ran Out of Food in the Last Year: Never true   Transportation Needs: No Transportation Needs  . Lack of Transportation (Medical): No  . Lack of Transportation (Non-Medical): No  Physical Activity: Not on file  Stress: Not on file  Social Connections: Not on file  Intimate Partner Violence: Not on file    ROS Review of Systems  Constitutional: Negative.   HENT: Positive for dental problem (loose teeth).   Eyes: Negative.   Respiratory: Positive for shortness of breath (with moderate exertion).   Cardiovascular: Positive for leg swelling (intermittent swelling).  Gastrointestinal: Negative.   Endocrine: Negative.   Genitourinary: Negative.   Musculoskeletal: Negative.   Skin: Negative.   Neurological: Negative.   Hematological: Negative.   Psychiatric/Behavioral: Negative.     Objective:   Today's Vitals: BP 138/88 (BP Location: Right Arm, Patient Position: Sitting, Cuff Size: Large) Comment: manual  Pulse 70   Temp 97.7 F (36.5 C)   Resp 16   Ht 6' 0.5" (1.842 m)   Wt 210 lb 3.2 oz (95.3 kg)   SpO2 98%   BMI 28.12 kg/m   Physical Exam HENT:     Nose: Nose normal.     Mouth/Throat:     Mouth: Mucous membranes are moist.   Eyes:     Extraocular Movements: Extraocular movements intact.     Conjunctiva/sclera: Conjunctivae normal.     Pupils: Pupils are equal, round, and reactive to light.  Cardiovascular:     Rate and Rhythm: Normal rate and regular rhythm.     Pulses: Normal pulses.     Heart sounds: Normal heart sounds.  Pulmonary:     Effort: Pulmonary effort is normal.     Breath sounds: Normal breath sounds.  Abdominal:     General: Abdomen is flat. Bowel sounds are normal.     Palpations: Abdomen is soft.  Genitourinary:    Comments: Deferred per patient Musculoskeletal:        General: Swelling (trace edema to lower legs) and tenderness (to right ankle with dorsiflexion of foot.) present.     Cervical back: Normal range of motion.  Skin:    General: Skin is warm.  Neurological:     General:  No focal deficit present.     Mental Status: He is alert and oriented to person, place, and time. Mental status is at baseline.  Psychiatric:        Mood and Affect: Mood normal.        Behavior: Behavior normal.        Thought Content: Thought content normal.        Judgment: Judgment normal.     Assessment & Plan:    1. Encounter to establish care -Routine labs will be checked - CBC w/Diff; Future -  Comp Met (CMET); Future - HgB A1c; Future - Lipid panel; Future - Urinalysis; Future - TSH; Future - TSH - Urinalysis - Lipid panel - HgB A1c - Comp Met (CMET) - CBC w/Diff  2. Gastroesophageal reflux disease without esophagitis - He will continue on current medication -Avoid spicy, fatty and fried food -Avoid sodas and sour juices -Avoid heavy meals -Avoid eating 4 hours before bedtime -Elevate head of bed at night - omeprazole (PRILOSEC) 20 MG capsule; Take 1 capsule (20 mg total) by mouth daily.  Dispense: 90 capsule; Refill: 2  3. Congestive heart failure, unspecified HF chronicity, unspecified heart failure type ([place]) -He will continue on current medication, DASH diet, weight self daily and notify clinic with weight greater than 5 pounds. He will follow up at CHF clinic. - carvedilol (COREG) 6.25 MG tablet; Take 1 tablet (6.25 mg total) by mouth 2 (two) times daily with a meal.  Dispense: 60 tablet; Refill: 2 - AMB referral to CHF clinic  4. Hypertension, unspecified type - His blood pressure is under control, will continue on current medication, advised to elevate legs while sitting down. - chlorthalidone (HYGROTON) 25 MG tablet; Take 1 tablet (25 mg total) by mouth daily.  Dispense: 30 tablet; Refill: 2  5. Mixed hyperlipidemia - He will continue on current medication, low fat/ cholesterol diet and exercise as tolerated. - atorvastatin (LIPITOR) 20 MG tablet; Take 1 tablet (20 mg total) by mouth daily.  Dispense: 30 tablet; Refill: 2  6. Chronic nonintractable  headache, unspecified headache type - He will start on Imitrex, and also continue on Advil as needed. He was advised to go to the ED for worsening symptoms. - SUMAtriptan (IMITREX) 50 MG tablet; Take 1 tablet (50 mg total) by mouth once for 1 dose. May repeat in 2 hours if headache persists or recurs.  Dispense: 10 tablet; Refill: 0  7. Erectile dysfunction, unspecified erectile dysfunction type -He was advised to complete [employer] application for Urology referral - Ambulatory referral to Urology  8. Chronic pain of right ankle - He was advised to take 650 mg Tylenol bid, and will follow up with [facility] Dr Vickki Hearing  9. Cervicalgia -He was advised to take 650 mg Tylenol bid, and will follow up with [facility] Dr Vickki Hearing   10. Loose, teeth -He was advised to perform proper oral hygiene and Dental referral was ordered.  11. History of asthma - His breathing is stable and he will use Albuterol inhaler as needed. - albuterol (VENTOLIN HFA) 108 (90 Base) MCG/ACT inhaler; Inhale 2 puffs into the lungs every 6 (six) hours as needed for wheezing or shortness of breath.  Dispense: 6.7 g; Refill: 2    Follow-up: Return in about 2 weeks (around 01/21/2021), or if symptoms worsen or fail to improve.   Dontasia Miranda Jerold Coombe, NP

## 2021-01-09 ENCOUNTER — Other Ambulatory Visit: Payer: Self-pay

## 2021-01-21 ENCOUNTER — Other Ambulatory Visit: Payer: Self-pay

## 2021-01-21 ENCOUNTER — Ambulatory Visit: Payer: Medicaid Other | Admitting: Gerontology

## 2021-01-21 VITALS — BP 130/91 | HR 74 | Temp 97.3°F | Resp 16 | Ht 73.5 in | Wt 210.9 lb

## 2021-01-21 DIAGNOSIS — I10 Essential (primary) hypertension: Secondary | ICD-10-CM

## 2021-01-21 MED ORDER — LOSARTAN POTASSIUM 25 MG PO TABS
25.0000 mg | ORAL_TABLET | Freq: Every day | ORAL | 0 refills | Status: DC
  Filled 2021-01-21: qty 30, 30d supply, fill #0
  Filled 2021-01-21: qty 15, 30d supply, fill #0

## 2021-01-21 NOTE — Progress Notes (Signed)
Patient ID: Matthew Marquez, male    DOB: 1970-10-23, 50 y.o.   MRN: [id number]  HPI  Matthew Marquez is a 50 y/o male with a history of asthma, HTN, cervicalgia and chronic heart failure.   No echo has been done.   Has not been admitted or been in the ED in the last 6 months.   Matthew Marquez presents today for his initial visit with a chief complaint of moderate shortness of breath with little exertion. Matthew Marquez describes this as chronic in nature having been present for several years. Matthew Marquez has associated fatigue, chest tightness, palpitations, pedal edema, chronic pain, weakness, light-headedness, anxiety and difficulty sleeping along with this. Matthew Marquez denies any abdominal distention, chest pain, wheezing or cough.   Does not have scales at home. Admits that Matthew Marquez was pretty anxious about coming to the appointment today because Matthew Marquez didn't know what to expect.   Past Medical History:  Diagnosis Date   Acid reflux    Asthma    Bruising    Cervicalgia    Chronic headaches    Congestive heart failure (HCC)    pt unsure if correct dx   Hypertension    Hypoglycemia    Past Surgical History:  Procedure Laterality Date   MOUTH SURGERY     Family History  Problem Relation Age of Onset   Cancer Mother    Hyperlipidemia Mother    Hypertension Mother    Diabetes Maternal Grandmother    Hypertension Maternal Grandmother    Suicidality Father    Edema Sister        LE   Cancer Brother    Social History   Tobacco Use   Smoking status: Former    Pack years: 0.00    Types: Cigarettes    Quit date: 2015    Years since quitting: 7.4   Smokeless tobacco: Never  Substance Use Topics   Alcohol use: Not Currently    Alcohol/week: 1.0 standard drink    Types: 1 Cans of beer per week    Comment: social, last use 2010   No Known Allergies  Prior to Admission medications   Medication Sig Start Date End Date Taking? Authorizing Provider  albuterol (VENTOLIN HFA) 108 (90 Base) MCG/ACT inhaler Inhale 2 puffs  into the lungs every 6 (six) hours as needed for wheezing or shortness of breath. 01/07/21  Yes Iloabachie, Chioma E, NP  atorvastatin (LIPITOR) 20 MG tablet Take 1 tablet (20 mg total) by mouth daily. 01/07/21  Yes Iloabachie, Chioma E, NP  carvedilol (COREG) 6.25 MG tablet Take 1 tablet (6.25 mg total) by mouth 2 (two) times daily with a meal. 01/07/21  Yes Iloabachie, Chioma E, NP  chlorthalidone (HYGROTON) 25 MG tablet Take 1 tablet (25 mg total) by mouth daily. 01/07/21  Yes Iloabachie, Chioma E, NP  losartan (COZAAR) 25 MG tablet Take 1 tablet (25 mg total) by mouth daily. 01/21/21  Yes Iloabachie, Chioma E, NP  omeprazole (PRILOSEC) 20 MG capsule Take 1 capsule (20 mg total) by mouth daily. 01/07/21  Yes Iloabachie, Chioma E, NP  SUMAtriptan (IMITREX) 50 MG tablet Take 1 tablet (50 mg total) by mouth once for 1 dose. May repeat in 2 hours if headache persists or recurs. 01/07/21  Yes Iloabachie, Chioma E, NP  acetaminophen (TYLENOL) 500 MG tablet Take 1 tablet (500 mg total) by mouth 2 (two) times daily. 01/22/21   Delma Freeze, FNP   Review of Systems  Constitutional:  Positive for fatigue. Negative  for appetite change.  HENT:  Negative for congestion, postnasal drip and sore throat.   Eyes: Negative.   Respiratory:  Positive for chest tightness (when short of breath) and shortness of breath (easily). Negative for cough and wheezing.   Cardiovascular:  Positive for palpitations and leg swelling. Negative for chest pain.  Gastrointestinal:  Negative for abdominal distention and abdominal pain.  Endocrine: Negative.   Genitourinary: Negative.   Musculoskeletal:  Positive for arthralgias (right ankle), back pain and neck pain.  Skin: Negative.   Allergic/Immunologic: Negative.   Neurological:  Positive for weakness and light-headedness. Negative for dizziness.  Hematological:  Negative for adenopathy. Does not bruise/bleed easily.  Psychiatric/Behavioral:  Positive for sleep disturbance.  Negative for dysphoric mood. The patient is nervous/anxious.    Vitals:   01/22/21 1053  BP: (!) 124/93  Pulse: 83  Resp: 20  SpO2: 99%  Weight: 210 lb 8 oz (95.5 kg)  Height: 6\' 1"  (1.854 m)   Wt Readings from Last 3 Encounters:  01/22/21 210 lb 8 oz (95.5 kg)  01/21/21 210 lb 14.4 oz (95.7 kg)  01/07/21 210 lb 3.2 oz (95.3 kg)   Lab Results  Component Value Date   CREATININE 1.12 01/21/2021   CREATININE 1.23 12/20/2018   CREATININE 1.50 (H) 06/06/2018    Physical Exam Vitals and nursing note reviewed.  Constitutional:      Appearance: Normal appearance.  HENT:     Head: Normocephalic and atraumatic.  Cardiovascular:     Rate and Rhythm: Normal rate and regular rhythm.  Pulmonary:     Effort: Pulmonary effort is normal. No respiratory distress.     Breath sounds: No wheezing or rales.  Abdominal:     General: There is no distension.     Palpations: Abdomen is soft.     Tenderness: There is no abdominal tenderness.  Musculoskeletal:        General: No tenderness.     Cervical back: Normal range of motion and neck supple.     Right lower leg: Edema (trace pitting) present.     Left lower leg: Edema (trace pitting) present.  Skin:    General: Skin is warm and dry.  Neurological:     General: No focal deficit present.     Mental Status: Matthew Marquez is alert and oriented to person, place, and time.  Psychiatric:        Mood and Affect: Mood is anxious.        Behavior: Behavior normal.        Thought Content: Thought content normal.    Assessment & Plan:  1: Chronic heart failure with preserved ejection fraction- - NYHA class III - euvolemic today - scales given today and Matthew Marquez was instructed to weigh himself every morning after using the bathroom, write the weight down and call for an overnight weight gain of > 2 pounds or a weekly weight gain of > 5 pounds - not adding salt to his food; low sodium cookbook given to him and advised him to keep his daily sodium intake to <  2000mg / day - scheduled echo for 02/23/21 - depending on echo results, may need to adjust medications - BNP 02/02/18 was 41.9  2: HTN- - BP looks good today - saw PCP at [facility] 01/21/21 & had lab work done - [facility] 12/20/2018 reviewed and showed sodium 140, potassium 4.1, creatinine 1.23 and GFR 69   Medication bottles reviewed.   Return in 5 weeks or sooner for  any questions/problems before then.

## 2021-01-21 NOTE — Patient Instructions (Signed)

## 2021-01-21 NOTE — Progress Notes (Signed)
Established Patient Office Visit  Subjective:  Patient ID: Matthew Marquez, male    DOB: 05/07/1971  Age: 50 y.o. MRN: [id number]  CC:  Chief Complaint  Patient presents with  . Follow-up    Labs done 01/07/21  . Hypertension    Patient states his BP has been elevated. Patient took 2 extra doses of Carvedilol yesterday.    HPI Matthew Marquez is a 50 y/o male who has history of Acid reflux, Asthma, Cervicalgia, CHF, Hypertension, presents for routine follow up . He reports taking 25 mg of Coreg yesterday due to elevated blood pressure. He checks his blood pressure at home, but forgot to bring his log. He denies chest pain, headache, palpitation, dizziness and vision changes. Overall, he states that he's doing well and offers no further complaint.  Past Medical History:  Diagnosis Date  . Acid reflux   . Asthma   . Bruising   . Cervicalgia   . Chronic headaches   . Congestive heart failure (HCC)    pt unsure if correct dx  . Hypertension   . Hypoglycemia     Past Surgical History:  Procedure Laterality Date  . MOUTH SURGERY      Family History  Problem Relation Age of Onset  . Cancer Mother   . Hyperlipidemia Mother   . Hypertension Mother   . Diabetes Maternal Grandmother   . Hypertension Maternal Grandmother   . Suicidality Father   . Edema Sister        LE  . Cancer Brother     Social History   Socioeconomic History  . Marital status: Legally Separated    Spouse name: Not on file  . Number of children: 3  . Years of education: 69  . Highest education level: GED or equivalent  Occupational History  . Occupation: unemployed  Tobacco Use  . Smoking status: Former Smoker    Quit date: 2015    Years since quitting: 7.4  . Smokeless tobacco: Never Used  Vaping Use  . Vaping Use: Never used  Substance and Sexual Activity  . Alcohol use: Not Currently    Alcohol/week: 1.0 standard drink    Types: 1 Cans of beer per week    Comment: social, last  use 2010  . Drug use: Never  . Sexual activity: Not Currently  Other Topics Concern  . Not on file  Social History Narrative   Pt owns home but there is no power or water. He gets water and takes showers from his brothers house. Cooks on a grill. Right now he drives his brothers motorcycle. States he enjoys "living off the grid". Pt is single   Was on food stamps but can't reapply now. Also mentioned he is currently unemployed but taking his final GED test and looking for a job   Social Determinants of Corporate investment banker Strain: Not on file  Food Insecurity: No Food Insecurity  . Worried About Programme researcher, broadcasting/film/video in the Last Year: Never true  . Ran Out of Food in the Last Year: Never true  Transportation Needs: No Transportation Needs  . Lack of Transportation (Medical): No  . Lack of Transportation (Non-Medical): No  Physical Activity: Not on file  Stress: Not on file  Social Connections: Not on file  Intimate Partner Violence: Not on file    Outpatient Medications Prior to Visit  Medication Sig Dispense Refill  . acetaminophen (TYLENOL) 500 MG tablet Take 500 mg by mouth  2 (two) times daily.    Marland Kitchen albuterol (VENTOLIN HFA) 108 (90 Base) MCG/ACT inhaler Inhale 2 puffs into the lungs every 6 (six) hours as needed for wheezing or shortness of breath. 6.7 g 2  . atorvastatin (LIPITOR) 20 MG tablet Take 1 tablet (20 mg total) by mouth daily. 30 tablet 2  . carvedilol (COREG) 6.25 MG tablet Take 1 tablet (6.25 mg total) by mouth 2 (two) times daily with a meal. 60 tablet 2  . chlorthalidone (HYGROTON) 25 MG tablet Take 1 tablet (25 mg total) by mouth daily. 30 tablet 2  . omeprazole (PRILOSEC) 20 MG capsule Take 1 capsule (20 mg total) by mouth daily. 90 capsule 2  . SUMAtriptan (IMITREX) 50 MG tablet Take 1 tablet (50 mg total) by mouth once for 1 dose. May repeat in 2 hours if headache persists or recurs. 10 tablet 0  . sildenafil (VIAGRA) 100 MG tablet Take 0.5-1 tablets  (50-100 mg total) by mouth daily as needed for erectile dysfunction. (Patient not taking: No sig reported) 30 tablet 1  . losartan (COZAAR) 100 MG tablet Take 1 tablet (100 mg total) by mouth daily. (Patient not taking: No sig reported) 90 tablet 2  . meloxicam (MOBIC) 15 MG tablet TAKE 1 TABLET(15 MG) BY MOUTH DAILY (Patient not taking: Reported on 01/07/2021) 30 tablet 0   No facility-administered medications prior to visit.    No Known Allergies  ROS Review of Systems  Constitutional: Negative.   Eyes: Negative.   Respiratory: Negative.   Cardiovascular: Negative.   Neurological: Negative.   Psychiatric/Behavioral: Negative.       Objective:    Physical Exam HENT:     Head: Normocephalic.  Eyes:     Extraocular Movements: Extraocular movements intact.     Conjunctiva/sclera: Conjunctivae normal.     Pupils: Pupils are equal, round, and reactive to light.  Cardiovascular:     Rate and Rhythm: Normal rate and regular rhythm.     Pulses: Normal pulses.     Heart sounds: Normal heart sounds.  Pulmonary:     Effort: Pulmonary effort is normal.     Breath sounds: Normal breath sounds.  Skin:    General: Skin is warm.  Neurological:     General: No focal deficit present.     Mental Status: He is alert and oriented to person, place, and time. Mental status is at baseline.  Psychiatric:        Mood and Affect: Mood normal.        Behavior: Behavior normal.        Thought Content: Thought content normal.        Judgment: Judgment normal.     BP (!) 130/91 (BP Location: Left Arm, Patient Position: Sitting, Cuff Size: Large)   Pulse 74   Temp (!) 97.3 F (36.3 C)   Resp 16   Ht 6' 1.5" (1.867 m)   Wt 210 lb 14.4 oz (95.7 kg)   SpO2 96%   BMI 27.45 kg/m  Wt Readings from Last 3 Encounters:  01/21/21 210 lb 14.4 oz (95.7 kg)  01/07/21 210 lb 3.2 oz (95.3 kg)  02/28/19 222 lb (100.7 kg)     Health Maintenance Due  Topic Date Due  . COVID-19 Vaccine (1) Never  done  . Pneumococcal Vaccine 67-106 Years old (1 of 4 - PCV13) Never done  . HIV Screening  Never done  . Hepatitis C Screening  Never done  . TETANUS/TDAP  Never done  .  COLONOSCOPY (Pts 45-38yrs Insurance coverage will need to be confirmed)  Never done  . Zoster Vaccines- Shingrix (1 of 2) Never done    There are no preventive care reminders to display for this patient.  Lab Results  Component Value Date   TSH 0.927 12/20/2018   Lab Results  Component Value Date   WBC 6.4 12/20/2018   HGB 15.0 12/20/2018   HCT 43.5 12/20/2018   MCV 85 12/20/2018   PLT 272 12/20/2018   Lab Results  Component Value Date   NA 140 12/20/2018   K 4.1 12/20/2018   CO2 24 12/20/2018   GLUCOSE 124 (H) 12/20/2018   BUN 21 12/20/2018   CREATININE 1.23 12/20/2018   BILITOT 0.3 12/20/2018   ALKPHOS 80 12/20/2018   AST 15 12/20/2018   ALT 20 12/20/2018   PROT 6.9 12/20/2018   ALBUMIN 4.4 12/20/2018   CALCIUM 9.4 12/20/2018   Lab Results  Component Value Date   CHOL 214 (H) 12/20/2018   Lab Results  Component Value Date   HDL 33 (L) 12/20/2018   Lab Results  Component Value Date   LDLCALC 110 (H) 12/20/2018   Lab Results  Component Value Date   TRIG 355 (H) 12/20/2018   Lab Results  Component Value Date   CHOLHDL 6.1 (H) 06/06/2018   Lab Results  Component Value Date   HGBA1C 5.4 02/02/2018      Assessment & Plan:   1. Hypertension, unspecified type -His blood pressure was normal when rechecked, added Losartan 25 mg to his regimen, educated on side effects and advised to notify clinic. He was provided with blood pressure kit, advised to check it once and not many times after the first wawas elevated. Her verbalized understanding. He was advised to continue on DASH diet and exercise as tolerated. - losartan (COZAAR) 25 MG tablet; Take 1 tablet (25 mg total) by mouth daily.  Dispense: 30 tablet; Refill: 0 - CBC w/Diff - Comp Met (CMET) - TSH - Hemoglobin A1c - Lipid  Profile - Urinalysis     Follow-up: Return in about 3 weeks (around 02/11/2021), or if symptoms worsen or fail to improve.    Duvan Mousel Jerold Coombe, NP

## 2021-01-22 ENCOUNTER — Ambulatory Visit: Payer: Medicaid Other | Attending: Family | Admitting: Family

## 2021-01-22 ENCOUNTER — Other Ambulatory Visit: Payer: Self-pay

## 2021-01-22 ENCOUNTER — Encounter: Payer: Self-pay | Admitting: Family

## 2021-01-22 VITALS — BP 124/93 | HR 83 | Resp 20 | Ht 73.0 in | Wt 210.5 lb

## 2021-01-22 DIAGNOSIS — Z87891 Personal history of nicotine dependence: Secondary | ICD-10-CM | POA: Insufficient documentation

## 2021-01-22 DIAGNOSIS — I5032 Chronic diastolic (congestive) heart failure: Secondary | ICD-10-CM

## 2021-01-22 DIAGNOSIS — I509 Heart failure, unspecified: Secondary | ICD-10-CM | POA: Insufficient documentation

## 2021-01-22 DIAGNOSIS — Z8249 Family history of ischemic heart disease and other diseases of the circulatory system: Secondary | ICD-10-CM | POA: Insufficient documentation

## 2021-01-22 DIAGNOSIS — I110 Hypertensive heart disease with heart failure: Secondary | ICD-10-CM | POA: Insufficient documentation

## 2021-01-22 DIAGNOSIS — I10 Essential (primary) hypertension: Secondary | ICD-10-CM

## 2021-01-22 DIAGNOSIS — Z79899 Other long term (current) drug therapy: Secondary | ICD-10-CM | POA: Insufficient documentation

## 2021-01-22 DIAGNOSIS — J45909 Unspecified asthma, uncomplicated: Secondary | ICD-10-CM | POA: Insufficient documentation

## 2021-01-22 LAB — COMPREHENSIVE METABOLIC PANEL
ALT: 24 IU/L (ref 0–44)
AST: 17 IU/L (ref 0–40)
Albumin/Globulin Ratio: 1.8 (ref 1.2–2.2)
Albumin: 4.5 g/dL (ref 3.8–4.9)
Alkaline Phosphatase: 72 IU/L (ref 44–121)
BUN/Creatinine Ratio: 18 (ref 9–20)
BUN: 20 mg/dL (ref 6–24)
Bilirubin Total: 0.3 mg/dL (ref 0.0–1.2)
CO2: 23 mmol/L (ref 20–29)
Calcium: 9.5 mg/dL (ref 8.7–10.2)
Chloride: 101 mmol/L (ref 96–106)
Creatinine, Ser: 1.12 mg/dL (ref 0.76–1.27)
Globulin, Total: 2.5 g/dL (ref 1.5–4.5)
Glucose: 104 mg/dL — ABNORMAL HIGH (ref 65–99)
Potassium: 3.4 mmol/L — ABNORMAL LOW (ref 3.5–5.2)
Sodium: 141 mmol/L (ref 134–144)
Total Protein: 7 g/dL (ref 6.0–8.5)
eGFR: 80 mL/min/{1.73_m2} (ref >59)

## 2021-01-22 LAB — CBC WITH DIFFERENTIAL/PLATELET
Basophils Absolute: 0.1 10*3/uL (ref 0.0–0.2)
Basos: 1 %
EOS (ABSOLUTE): 0.2 10*3/uL (ref 0.0–0.4)
Eos: 3 %
Hematocrit: 42.7 % (ref 37.5–51.0)
Hemoglobin: 14.4 g/dL (ref 13.0–17.7)
Immature Grans (Abs): 0 10*3/uL (ref 0.0–0.1)
Immature Granulocytes: 0 %
Lymphocytes Absolute: 1.5 10*3/uL (ref 0.7–3.1)
Lymphs: 21 %
MCH: 29.6 pg (ref 26.6–33.0)
MCHC: 33.7 g/dL (ref 31.5–35.7)
MCV: 88 fL (ref 79–97)
Monocytes Absolute: 0.7 10*3/uL (ref 0.1–0.9)
Monocytes: 10 %
Neutrophils Absolute: 4.7 10*3/uL (ref 1.4–7.0)
Neutrophils: 65 %
Platelets: 283 10*3/uL (ref 150–450)
RBC: 4.87 x10E6/uL (ref 4.14–5.80)
RDW: 13.4 % (ref 11.6–15.4)
WBC: 7.3 10*3/uL (ref 3.4–10.8)

## 2021-01-22 LAB — LIPID PANEL
Chol/HDL Ratio: 3.4 ratio (ref 0.0–5.0)
Cholesterol, Total: 132 mg/dL (ref 100–199)
HDL: 39 mg/dL — ABNORMAL LOW (ref >39)
LDL Chol Calc (NIH): 70 mg/dL (ref 0–99)
Triglycerides: 131 mg/dL (ref 0–149)
VLDL Cholesterol Cal: 23 mg/dL (ref 5–40)

## 2021-01-22 LAB — HEMOGLOBIN A1C
Est. average glucose Bld gHb Est-mCnc: 114 mg/dL
Hgb A1c MFr Bld: 5.6 % (ref 4.8–5.6)

## 2021-01-22 LAB — URINALYSIS
Bilirubin, UA: NEGATIVE
Glucose, UA: NEGATIVE
Ketones, UA: NEGATIVE
Leukocytes,UA: NEGATIVE
Nitrite, UA: NEGATIVE
Protein,UA: NEGATIVE
RBC, UA: NEGATIVE
Specific Gravity, UA: 1.016 (ref 1.005–1.030)
Urobilinogen, Ur: 0.2 mg/dL (ref 0.2–1.0)
pH, UA: 6 (ref 5.0–7.5)

## 2021-01-22 LAB — TSH: TSH: 0.823 u[IU]/mL (ref 0.450–4.500)

## 2021-01-22 MED ORDER — ACETAMINOPHEN 500 MG PO TABS
500.0000 mg | ORAL_TABLET | Freq: Two times a day (BID) | ORAL | 3 refills | Status: DC
  Filled 2021-01-22: qty 180, 90d supply, fill #0

## 2021-01-22 NOTE — Patient Instructions (Addendum)
Begin weighing daily and call for an overnight weight gain of > 2 pounds or a weekly weight gain of >5 pounds. 

## 2021-01-23 ENCOUNTER — Encounter: Payer: Self-pay | Admitting: Family

## 2021-01-27 ENCOUNTER — Other Ambulatory Visit: Payer: Self-pay

## 2021-01-27 ENCOUNTER — Ambulatory Visit: Payer: Medicaid Other | Admitting: Specialist

## 2021-01-27 DIAGNOSIS — G8929 Other chronic pain: Secondary | ICD-10-CM

## 2021-01-27 MED ORDER — NAPROXEN 500 MG PO TABS
500.0000 mg | ORAL_TABLET | Freq: Two times a day (BID) | ORAL | 0 refills | Status: DC
  Filled 2021-01-27 (×2): qty 60, 30d supply, fill #0

## 2021-01-27 NOTE — Progress Notes (Signed)
  Subjective:     Patient ID: Matthew Marquez, male   DOB: [birth year], 50 y.o.   MRN: [id number]  HPI History of twist in ankle several years ago. Now he has pain with increase walking or if he steps on a pebble. He currently is not working. He can not afford OTC medications.   Review of Systems     Objective:   Physical Exam    His gait is normal. He is able to heel toe walk. On inspection, there is some chronic discoloration, lateral aspect right ankle. His ROM is excellent and equals the opposite side. He has 1+ valgus laxity bilaterally NEG anterior drawer. I am going to put him on Naproxen since he can get that at a clinic. We will x-ray his right ankle, and see him at the next visit.  Assessment:         Plan:        Right ankle X-ray, history of recurrent strain Naproxen 500 mg 1 bid, 60 tablets, 1 refill Schedule appt next time

## 2021-01-28 ENCOUNTER — Other Ambulatory Visit: Payer: Self-pay

## 2021-01-28 ENCOUNTER — Ambulatory Visit (INDEPENDENT_AMBULATORY_CARE_PROVIDER_SITE_OTHER): Payer: Medicaid Other | Admitting: Urology

## 2021-01-28 ENCOUNTER — Ambulatory Visit
Admission: RE | Admit: 2021-01-28 | Discharge: 2021-01-28 | Disposition: A | Discharge status: Home / Self Care | Payer: Medicaid Other | Attending: Gerontology | Admitting: Gerontology

## 2021-01-28 ENCOUNTER — Encounter: Payer: Self-pay | Admitting: Urology

## 2021-01-28 ENCOUNTER — Ambulatory Visit
Admission: RE | Admit: 2021-01-28 | Discharge: 2021-01-28 | Disposition: A | Discharge status: Home / Self Care | Payer: Medicaid Other | Source: Ambulatory Visit | Attending: Gerontology | Admitting: Gerontology

## 2021-01-28 VITALS — BP 144/81 | HR 74 | Ht 73.0 in | Wt 212.0 lb

## 2021-01-28 DIAGNOSIS — N5201 Erectile dysfunction due to arterial insufficiency: Secondary | ICD-10-CM

## 2021-01-28 DIAGNOSIS — M25571 Pain in right ankle and joints of right foot: Secondary | ICD-10-CM

## 2021-01-28 DIAGNOSIS — G8929 Other chronic pain: Secondary | ICD-10-CM | POA: Insufficient documentation

## 2021-01-28 MED ORDER — TADALAFIL 10 MG PO TABS
ORAL_TABLET | ORAL | 0 refills | Status: DC
  Filled 2021-01-28: qty 10, 30d supply, fill #0

## 2021-01-28 NOTE — Progress Notes (Signed)
01/28/2021 10:56 AM   Matthew Marquez 1970-10-24 [id number]  Referring provider: Rolm Gala, NP [address] [place],  [place] [address]  Chief Complaint  Patient presents with   Erectile Dysfunction    HPI: 50 y.o. male referred for evaluation of erectile dysfunction.  States he has had the ED "as long as I can remember" Years ago and had difficulty maintaining an erection Recently with progressive difficulty achieving and maintaining an erection Not able to achieve an erection firm enough for penetration No pain or curvature with erection SHIM 5/25 indicating severe ED Has tried Viagra and notes tumescence but no rigidity Organic risk factors include hypertension, hyperlipidemia, antihypertensive medication and prior tobacco history.   PMH: Past Medical History:  Diagnosis Date   Acid reflux    Asthma    Bruising    Cervicalgia    Chronic headaches    Congestive heart failure (HCC)    pt unsure if correct dx   Hypertension    Hypoglycemia     Surgical History: Past Surgical History:  Procedure Laterality Date   MOUTH SURGERY      Home Medications:  Allergies as of 01/28/2021   No Known Allergies      Medication List        Accurate as of January 28, 2021 10:56 AM. If you have any questions, ask your nurse or doctor.          albuterol 108 (90 Base) MCG/ACT inhaler Commonly known as: VENTOLIN HFA Inhale 2 puffs into the lungs every 6 (six) hours as needed for wheezing or shortness of breath.   atorvastatin 20 MG tablet Commonly known as: LIPITOR Take 1 tablet (20 mg total) by mouth daily.   carvedilol 6.25 MG tablet Commonly known as: COREG Take 1 tablet (6.25 mg total) by mouth 2 (two) times daily with a meal.   chlorthalidone 25 MG tablet Commonly known as: HYGROTON Take 1 tablet (25 mg total) by mouth daily.   losartan 25 MG tablet Commonly known as: COZAAR Take 1 tablet (25 mg total) by mouth daily.   naproxen  500 MG tablet Commonly known as: NAPROSYN Take 1 tablet (500 mg total) by mouth 2 (two) times daily with a meal.   omeprazole 20 MG capsule Commonly known as: PRILOSEC Take 1 capsule (20 mg total) by mouth daily.   SUMAtriptan 50 MG tablet Commonly known as: Imitrex Take 1 tablet (50 mg total) by mouth once for 1 dose. May repeat in 2 hours if headache persists or recurs.        Allergies: No Known Allergies  Family History: Family History  Problem Relation Age of Onset   Cancer Mother    Hyperlipidemia Mother    Hypertension Mother    Diabetes Maternal Grandmother    Hypertension Maternal Grandmother    Suicidality Father    Edema Sister        LE   Cancer Brother     Social History:  reports that he quit smoking about 7 years ago. His smoking use included cigarettes. He has never used smokeless tobacco. He reports previous alcohol use of about 1.0 standard drink of alcohol per week. He reports that he does not use drugs.   Physical Exam: BP (!) 144/81   Pulse 74   Ht 6\' 1"  (1.854 m)   Wt 212 lb (96.2 kg)   BMI 27.97 kg/m   Constitutional:  Alert and oriented, No acute distress. HEENT: [place] AT,  moist mucus membranes.  Trachea midline, no masses. Cardiovascular: No clubbing, cyanosis, or edema. Respiratory: Normal respiratory effort, no increased work of breathing. GU: Phallus circumcised without lesions or plaques, testes descended bilaterally without masses or tenderness; estimated volume 15 cc bilaterally Skin: No rashes, bruises or suspicious lesions. Neurologic: Grossly intact, no focal deficits, moving all 4 extremities. Psychiatric: Normal mood and affect.   Assessment & Plan:    1.  Erectile dysfunction Chronic with progression Sildenafil 100 mg not effective We discussed second line options including intracavernosal injections and vacuum erection devices.  He was provided literature on intracavernosal injections He inquired if any other oral  medications are available.  We discussed that PDE 5 inhibitors are ~ 70% effective and 1 is not considered superior however he did request a trial of tadalafil and Rx 20 mg tabs sent to pharmacy to take 1 hour prior to intercourse.  He was instructed to use this med at least 5 times to adequately determine efficacy unless he has side effects.   Riki Altes, MD  [facility] [address], [address] [place], [place] [address] [phone]

## 2021-02-03 ENCOUNTER — Other Ambulatory Visit: Payer: Self-pay

## 2021-02-03 ENCOUNTER — Ambulatory Visit: Payer: Medicaid Other | Admitting: Pharmacy Technician

## 2021-02-03 ENCOUNTER — Other Ambulatory Visit: Payer: Self-pay | Admitting: Gerontology

## 2021-02-03 DIAGNOSIS — Z79899 Other long term (current) drug therapy: Secondary | ICD-10-CM

## 2021-02-03 NOTE — Progress Notes (Signed)
Completed [facility] application and contract.  Patient agreed to all terms of the [facility] contract.    Patient approved to receive medication assistance at [facility] until time for re-certification in 2023, and as long as eligibility criteria continues to be met.    Provided patient with community resource material based on his particular needs.    Betty J. Kluttz Care Manager [facility]  

## 2021-02-04 ENCOUNTER — Other Ambulatory Visit: Payer: Self-pay

## 2021-02-09 ENCOUNTER — Other Ambulatory Visit: Payer: Self-pay

## 2021-02-10 ENCOUNTER — Other Ambulatory Visit: Payer: Self-pay

## 2021-02-10 ENCOUNTER — Ambulatory Visit: Payer: Medicaid Other | Admitting: Specialist

## 2021-02-10 NOTE — Progress Notes (Unsigned)
  Subjective:     Patient ID: Matthew Marquez, male   DOB: 12-May-1971, 50 y.o.   MRN: [id number]  HPI Review of X-rays are essentially negative.   Review of Systems     Objective:   Physical Exam    Remains the same.  Assessment:     ***    Plan:    He has never had physical therapy. I will send him for a HEP stressing proprioception.  ***

## 2021-02-11 ENCOUNTER — Encounter: Payer: Self-pay | Admitting: Gerontology

## 2021-02-11 ENCOUNTER — Ambulatory Visit: Payer: Medicaid Other | Admitting: Gerontology

## 2021-02-11 ENCOUNTER — Other Ambulatory Visit: Payer: Self-pay

## 2021-02-11 VITALS — BP 127/85 | HR 69 | Temp 97.9°F | Resp 16 | Ht 74.0 in | Wt 210.5 lb

## 2021-02-11 DIAGNOSIS — I509 Heart failure, unspecified: Secondary | ICD-10-CM

## 2021-02-11 DIAGNOSIS — E782 Mixed hyperlipidemia: Secondary | ICD-10-CM

## 2021-02-11 DIAGNOSIS — I10 Essential (primary) hypertension: Secondary | ICD-10-CM

## 2021-02-11 MED ORDER — LOSARTAN POTASSIUM 25 MG PO TABS
25.0000 mg | ORAL_TABLET | Freq: Every day | ORAL | 0 refills | Status: DC
  Filled 2021-02-11 – 2021-03-02 (×2): qty 30, 30d supply, fill #0

## 2021-02-11 MED ORDER — CHLORTHALIDONE 25 MG PO TABS
25.0000 mg | ORAL_TABLET | Freq: Every day | ORAL | 2 refills | Status: DC
Start: 2021-02-11 — End: 2021-08-17
  Filled 2021-02-11 – 2021-03-02 (×2): qty 30, 30d supply, fill #0
  Filled 2021-04-01: qty 30, 30d supply, fill #1
  Filled 2021-04-29: qty 30, 30d supply, fill #2

## 2021-02-11 MED ORDER — ATORVASTATIN CALCIUM 20 MG PO TABS
20.0000 mg | ORAL_TABLET | Freq: Every day | ORAL | 2 refills | Status: DC
  Filled 2021-02-11 – 2021-03-02 (×2): qty 30, 30d supply, fill #0
  Filled 2021-04-01: qty 30, 30d supply, fill #1
  Filled 2021-04-29: qty 30, 30d supply, fill #2

## 2021-02-11 MED ORDER — CARVEDILOL 6.25 MG PO TABS
6.2500 mg | ORAL_TABLET | Freq: Two times a day (BID) | ORAL | 2 refills | Status: DC
  Filled 2021-02-11 – 2021-03-02 (×2): qty 60, 30d supply, fill #0
  Filled 2021-04-01: qty 60, 30d supply, fill #1

## 2021-02-11 NOTE — Progress Notes (Signed)
Established Patient Office Visit  Subjective:  Patient ID: Matthew Marquez, male    DOB: [birth year]  Age: 50 y.o. MRN: [id number]  CC:  Chief Complaint  Patient presents with   Follow-up   Hypertension    Labs drawn 01/21/21    HPI Matthew Marquez presents is a 50 year old male with a PMH of Asthma, Acid Reflux, CHF, HTN, presents for follow up and lab review. He reports that his blood pressure is intermittently elevated when he checks it at home. He reports that he has been mistakenly taking half of 25 mg of Losartan instead of the full dose of 25 mg. His blood pressure today was 127/85. He was seen by Darylene Price, FNP at the CHF clinic on 01/22/21 and is scheduled for Echocardiogram on 02/23/21. He denies chest pain, palpitation, dizziness and edema in lower extremities. He saw Dr. John Giovanni for his erectile dysfunction on 01/28/21. Overall, he states that he is doing well and offers no further complaints.  Past Medical History:  Diagnosis Date   Acid reflux    Asthma    Bruising    Cervicalgia    Chronic headaches    Congestive heart failure ([place])    pt unsure if correct dx   Hypertension    Hypoglycemia     Past Surgical History:  Procedure Laterality Date   MOUTH SURGERY      Family History  Problem Relation Age of Onset   Cancer Mother    Hyperlipidemia Mother    Hypertension Mother    Diabetes Maternal Grandmother    Hypertension Maternal Grandmother    Suicidality Father    Edema Sister        LE   Cancer Brother     Social History   Socioeconomic History   Marital status: Legally Separated    Spouse name: Not on file   Number of children: 3   Years of education: 12   Highest education level: GED or equivalent  Occupational History   Occupation: unemployed  Tobacco Use   Smoking status: Former    Pack years: 0.00    Types: Cigarettes    Quit date: 2015    Years since quitting: 7.4   Smokeless tobacco: Never  Vaping Use   Vaping Use:  Never used  Substance and Sexual Activity   Alcohol use: Not Currently    Alcohol/week: 1.0 standard drink    Types: 1 Cans of beer per week    Comment: social, last use 2010   Drug use: Never   Sexual activity: Not Currently  Other Topics Concern   Not on file  Social History Narrative   Pt owns home but there is no power or water. He gets water and takes showers from his brothers house. Cooks on a grill. Right now he drives his brothers motorcycle. States he enjoys "living off the grid". Pt is single   Was on food stamps but can't reapply now. Also mentioned he is currently unemployed but taking his final GED test and looking for a job   Social Determinants of Radio broadcast assistant Strain: Not on file  Food Insecurity: No Food Insecurity   Worried About Charity fundraiser in the Last Year: Never true   Arboriculturist in the Last Year: Never true  Transportation Needs: No Transportation Needs   Lack of Transportation (Medical): No   Lack of Transportation (Non-Medical): No  Physical Activity: Not on file  Stress: Not on file  Social Connections: Not on file  Intimate Partner Violence: Not on file    Outpatient Medications Prior to Visit  Medication Sig Dispense Refill   albuterol (VENTOLIN HFA) 108 (90 Base) MCG/ACT inhaler Inhale 2 puffs into the lungs every 6 (six) hours as needed for wheezing or shortness of breath. 6.7 g 2   naproxen (NAPROSYN) 500 MG tablet Take 1 tablet (500 mg total) by mouth 2 (two) times daily with a meal. 60 tablet 0   omeprazole (PRILOSEC) 20 MG capsule Take 1 capsule (20 mg total) by mouth daily. 90 capsule 2   SUMAtriptan (IMITREX) 50 MG tablet Take 1 tablet (50 mg total) by mouth once for 1 dose. May repeat in 2 hours if headache persists or recurs. 10 tablet 0   tadalafil (CIALIS) 10 MG tablet Take 2 tablets (51m total) by mouth 1 hour prior to sexual activity. 10 tablet 0   atorvastatin (LIPITOR) 20 MG tablet Take 1 tablet (20 mg total)  by mouth daily. 30 tablet 2   carvedilol (COREG) 6.25 MG tablet Take 1 tablet (6.25 mg total) by mouth 2 (two) times daily with a meal. 60 tablet 2   chlorthalidone (HYGROTON) 25 MG tablet Take 1 tablet (25 mg total) by mouth daily. 30 tablet 2   losartan (COZAAR) 25 MG tablet Take 1 tablet (25 mg total) by mouth daily. 30 tablet 0   No facility-administered medications prior to visit.    No Known Allergies  ROS Review of Systems  Constitutional: Negative.   HENT: Negative.    Respiratory: Negative.    Cardiovascular: Negative.   Gastrointestinal: Negative.   Skin: Negative.   Neurological: Negative.   Psychiatric/Behavioral: Negative.       Objective:    Physical Exam Constitutional:      Appearance: Normal appearance.  HENT:     Head: Normocephalic.  Cardiovascular:     Rate and Rhythm: Normal rate and regular rhythm.  Pulmonary:     Effort: Pulmonary effort is normal.     Breath sounds: Normal breath sounds.  Abdominal:     General: Bowel sounds are normal.     Palpations: Abdomen is soft.  Skin:    General: Skin is warm and dry.  Neurological:     Mental Status: He is alert and oriented to person, place, and time.  Psychiatric:        Mood and Affect: Mood normal.    BP 127/85 (BP Location: Right Arm, Patient Position: Sitting, Cuff Size: Large)   Pulse 69   Temp 97.9 F (36.6 C)   Resp 16   Ht _0  (1.88 m)   Wt 210 lb 8 oz (95.5 kg)   SpO2 96%   BMI 27.03 kg/m  Wt Readings from Last 3 Encounters:  02/11/21 210 lb 8 oz (95.5 kg)  01/28/21 212 lb (96.2 kg)  01/22/21 210 lb 8 oz (95.5 kg)     Health Maintenance Due  Topic Date Due   COVID-19 Vaccine (1) Never done   Pneumococcal Vaccine 058647Years old (1 - PCV) Never done   HIV Screening  Never done   Hepatitis C Screening  Never done   TETANUS/TDAP  Never done   Zoster Vaccines- Shingrix (1 of 2) Never done   COLONOSCOPY (Pts 45-450yrInsurance coverage will need to be confirmed)  Never done     There are no preventive care reminders to display for this patient.  Lab Results  Component  Value Date   TSH 0.823 01/21/2021   Lab Results  Component Value Date   WBC 7.3 01/21/2021   HGB 14.4 01/21/2021   HCT 42.7 01/21/2021   MCV 88 01/21/2021   PLT 283 01/21/2021   Lab Results  Component Value Date   NA 141 01/21/2021   K 3.4 (L) 01/21/2021   CO2 23 01/21/2021   GLUCOSE 104 (H) 01/21/2021   BUN 20 01/21/2021   CREATININE 1.12 01/21/2021   BILITOT 0.3 01/21/2021   ALKPHOS 72 01/21/2021   AST 17 01/21/2021   ALT 24 01/21/2021   PROT 7.0 01/21/2021   ALBUMIN 4.5 01/21/2021   CALCIUM 9.5 01/21/2021   EGFR 80 01/21/2021   Lab Results  Component Value Date   CHOL 132 01/21/2021   Lab Results  Component Value Date   HDL 39 (L) 01/21/2021   Lab Results  Component Value Date   LDLCALC 70 01/21/2021   Lab Results  Component Value Date   TRIG 131 01/21/2021   Lab Results  Component Value Date   CHOLHDL 3.4 01/21/2021   Lab Results  Component Value Date   HGBA1C 5.6 01/21/2021      Assessment & Plan:   1. Mixed hyperlipidemia His Lipids profile has significantly improved from the last lab 2 years ago Continue medication regimen Continue low fat/low cholesterol diet and exercise as tolerated - atorvastatin (LIPITOR) 20 MG tablet; Take 1 tablet (20 mg total) by mouth once daily.  Dispense: 30 tablet; Refill: 2  2. Congestive heart failure, unspecified HF chronicity, unspecified heart failure type ([place]) Continue medication regimen Continue to follow up with CHF clinic Monitor weight daily and notify the clinic of overnight weight gain of >2 pound or a weekly weight gain of >5 pounds. - carvedilol (COREG) 6.25 MG tablet; Take 1 tablet (6.25 mg total) by mouth 2 (two) times daily with a meal.  Dispense: 60 tablet; Refill: 2  3. Hypertension, unspecified type Continue medication regimen Dash Diet encouraged - chlorthalidone (HYGROTON) 25 MG tablet;  Take 1 tablet (25 mg total) by mouth daily.  Dispense: 30 tablet; Refill: 2 - losartan (COZAAR) 25 MG tablet; Take 1 tablet (25 mg total) by mouth daily.  Dispense: 30 tablet; Refill: 0     Follow-up: Return in about 5 weeks (around 03/18/2021), or if symptoms worsen or fail to improve.    Danella Maiers, RN

## 2021-02-23 ENCOUNTER — Ambulatory Visit: Admission: RE | Admit: 2021-02-23 | Payer: Medicaid Other | Source: Ambulatory Visit

## 2021-02-26 ENCOUNTER — Other Ambulatory Visit: Payer: Self-pay

## 2021-02-26 ENCOUNTER — Ambulatory Visit: Payer: Medicaid Other | Attending: Family | Admitting: Family

## 2021-02-26 ENCOUNTER — Encounter: Payer: Self-pay | Admitting: Family

## 2021-02-26 VITALS — BP 122/84 | HR 76 | Resp 14 | Ht 73.0 in | Wt 218.1 lb

## 2021-02-26 DIAGNOSIS — R0602 Shortness of breath: Secondary | ICD-10-CM | POA: Insufficient documentation

## 2021-02-26 DIAGNOSIS — R5383 Other fatigue: Secondary | ICD-10-CM | POA: Insufficient documentation

## 2021-02-26 DIAGNOSIS — Z7901 Long term (current) use of anticoagulants: Secondary | ICD-10-CM | POA: Insufficient documentation

## 2021-02-26 DIAGNOSIS — R42 Dizziness and giddiness: Secondary | ICD-10-CM | POA: Insufficient documentation

## 2021-02-26 DIAGNOSIS — G8929 Other chronic pain: Secondary | ICD-10-CM | POA: Insufficient documentation

## 2021-02-26 DIAGNOSIS — R0789 Other chest pain: Secondary | ICD-10-CM | POA: Insufficient documentation

## 2021-02-26 DIAGNOSIS — I5032 Chronic diastolic (congestive) heart failure: Secondary | ICD-10-CM

## 2021-02-26 DIAGNOSIS — J45909 Unspecified asthma, uncomplicated: Secondary | ICD-10-CM | POA: Insufficient documentation

## 2021-02-26 DIAGNOSIS — M25571 Pain in right ankle and joints of right foot: Secondary | ICD-10-CM | POA: Insufficient documentation

## 2021-02-26 DIAGNOSIS — I110 Hypertensive heart disease with heart failure: Secondary | ICD-10-CM | POA: Insufficient documentation

## 2021-02-26 DIAGNOSIS — R002 Palpitations: Secondary | ICD-10-CM | POA: Insufficient documentation

## 2021-02-26 DIAGNOSIS — Z8249 Family history of ischemic heart disease and other diseases of the circulatory system: Secondary | ICD-10-CM | POA: Insufficient documentation

## 2021-02-26 DIAGNOSIS — Z87891 Personal history of nicotine dependence: Secondary | ICD-10-CM | POA: Insufficient documentation

## 2021-02-26 DIAGNOSIS — Z09 Encounter for follow-up examination after completed treatment for conditions other than malignant neoplasm: Secondary | ICD-10-CM | POA: Insufficient documentation

## 2021-02-26 DIAGNOSIS — I10 Essential (primary) hypertension: Secondary | ICD-10-CM

## 2021-02-26 DIAGNOSIS — Z79899 Other long term (current) drug therapy: Secondary | ICD-10-CM | POA: Insufficient documentation

## 2021-02-26 NOTE — Progress Notes (Signed)
Patient ID: Matthew Marquez, male    DOB: 03-12-1971, 50 y.o.   MRN: [id number]  HPI  Mr Brathwaite is a 50 y/o male with a history of asthma, HTN, cervicalgia and chronic heart failure.   No echo has been done.   Has not been admitted or been in the ED in the last 6 months.   He presents today for a follow-up visit with a chief complaint of moderate shortness of breath with minimal exertion. He describes this as chronic having been present for several months. He has associated chest tightness, light-headedness, fatigue, pedal edema, palpitations, difficulty sleeping, anxiety and chronic pain along with this. He denies cough, wheezing, chest pain, abdominal distention or weight gain.   Has been having some right ankle pain and is going to start physical therapy later today. Does report twisting his ankle earlier today when he stepped wrong on a small rock.   Past Medical History:  Diagnosis Date   Acid reflux    Asthma    Bruising    Cervicalgia    Chronic headaches    Congestive heart failure (HCC)    pt unsure if correct dx   Hypertension    Hypoglycemia    Past Surgical History:  Procedure Laterality Date   MOUTH SURGERY     Family History  Problem Relation Age of Onset   Cancer Mother    Hyperlipidemia Mother    Hypertension Mother    Diabetes Maternal Grandmother    Hypertension Maternal Grandmother    Suicidality Father    Edema Sister        LE   Cancer Brother    Social History   Tobacco Use   Smoking status: Former    Types: Cigarettes    Quit date: 2015    Years since quitting: 7.5   Smokeless tobacco: Never  Substance Use Topics   Alcohol use: Not Currently    Alcohol/week: 1.0 standard drink    Types: 1 Cans of beer per week    Comment: social, last use 2010   No Known Allergies  Prior to Admission medications   Medication Sig Start Date End Date Taking? Authorizing Provider  albuterol (VENTOLIN HFA) 108 (90 Base) MCG/ACT inhaler Inhale 2  puffs into the lungs every 6 (six) hours as needed for wheezing or shortness of breath. 01/07/21  Yes Iloabachie, Chioma E, NP  atorvastatin (LIPITOR) 20 MG tablet Take 1 tablet (20 mg total) by mouth once daily. 02/11/21  Yes Iloabachie, Chioma E, NP  carvedilol (COREG) 6.25 MG tablet Take 1 tablet (6.25 mg total) by mouth 2 (two) times daily with a meal. 02/11/21  Yes Iloabachie, Chioma E, NP  chlorthalidone (HYGROTON) 25 MG tablet Take 1 tablet (25 mg total) by mouth daily. 02/11/21  Yes Iloabachie, Chioma E, NP  losartan (COZAAR) 25 MG tablet Take 1 tablet (25 mg total) by mouth daily. 02/11/21  Yes Iloabachie, Chioma E, NP  naproxen (NAPROSYN) 500 MG tablet Take 1 tablet (500 mg total) by mouth 2 (two) times daily with a meal. 01/27/21  Yes Iloabachie, Chioma E, NP  omeprazole (PRILOSEC) 20 MG capsule Take 1 capsule (20 mg total) by mouth daily. 01/07/21  Yes Iloabachie, Chioma E, NP  SUMAtriptan (IMITREX) 50 MG tablet Take 1 tablet (50 mg total) by mouth once for 1 dose. May repeat in 2 hours if headache persists or recurs. 01/07/21  Yes Iloabachie, Chioma E, NP  tadalafil (CIALIS) 10 MG tablet Take 2 tablets (20mg   total) by mouth 1 hour prior to sexual activity. 01/28/21  Yes Stoioff, Verna Czech, MD    Review of Systems  Constitutional:  Positive for fatigue. Negative for appetite change.  HENT:  Negative for congestion, postnasal drip and sore throat.   Eyes: Negative.   Respiratory:  Positive for chest tightness (when short of breath) and shortness of breath (easily). Negative for cough and wheezing.   Cardiovascular:  Positive for palpitations and leg swelling. Negative for chest pain.  Gastrointestinal:  Negative for abdominal distention and abdominal pain.  Endocrine: Negative.   Genitourinary: Negative.   Musculoskeletal:  Positive for arthralgias (right ankle; just twisted it earlier), back pain and neck pain.  Skin: Negative.   Allergic/Immunologic: Negative.   Neurological:  Positive for  weakness (right ankle) and light-headedness. Negative for dizziness.  Hematological:  Negative for adenopathy. Does not bruise/bleed easily.  Psychiatric/Behavioral:  Positive for sleep disturbance (sleeping on 1-2 pillows). Negative for dysphoric mood. The patient is nervous/anxious.    Vitals:   02/26/21 1315 02/26/21 1317  BP: (!) 143/101 122/84  Pulse: 76   Resp: 14   SpO2: 99%   Weight: 218 lb 2 oz (98.9 kg)   Height: 6\' 1"  (1.854 m)    Wt Readings from Last 3 Encounters:  02/26/21 218 lb 2 oz (98.9 kg)  02/11/21 210 lb 8 oz (95.5 kg)  01/28/21 212 lb (96.2 kg)   Lab Results  Component Value Date   CREATININE 1.12 01/21/2021   CREATININE 1.23 12/20/2018   CREATININE 1.50 (H) 06/06/2018   Physical Exam Vitals and nursing note reviewed.  Constitutional:      Appearance: Normal appearance.  HENT:     Head: Normocephalic and atraumatic.  Cardiovascular:     Rate and Rhythm: Normal rate and regular rhythm.  Pulmonary:     Effort: Pulmonary effort is normal. No respiratory distress.     Breath sounds: No wheezing or rales.  Abdominal:     General: There is no distension.     Palpations: Abdomen is soft.     Tenderness: There is no abdominal tenderness.  Musculoskeletal:        General: No tenderness.     Cervical back: Normal range of motion and neck supple.     Right lower leg: Edema (trace pitting) present.     Left lower leg: Edema (trace pitting) present.  Skin:    General: Skin is warm and dry.  Neurological:     General: No focal deficit present.     Mental Status: He is alert and oriented to person, place, and time.  Psychiatric:        Mood and Affect: Mood is anxious.        Behavior: Behavior normal.        Thought Content: Thought content normal.    Assessment & Plan:  1: Chronic heart failure with preserved ejection fraction- - NYHA class III - euvolemic today - weighing daily; reminded to call for an overnight weight gain of > 2 pounds or a  weekly weight gain of > 5 pounds - weight up 8 pounds from last visit here 1 month ago - not adding salt to his food although he has been staying with his niece this week and knows that he's eaten more salt this week than usual; low sodium cookbook given to him - patient did not show for echo on 02/23/21; this was r/s for 03/27/21 - BNP 02/02/18 was 41.9  2: HTN- -  BP elevated today (143/101) but he had truck trouble before coming and he hasn't taken his losartan yet because he hasn't eaten lunch yet; planning to leave here, eat and take his medicine - saw PCP at [facility] 02/11/21 - CMP 01/21/21 reviewed and showed sodium 141, potassium 3.4, creatinine 1.12 and GFR 80   Medication bottles reviewed.   Return in 1 month or sooner for any questions/problems before then.

## 2021-02-26 NOTE — Patient Instructions (Signed)
Continue weighing daily and call for an overnight weight gain of > 2 pounds or a weekly weight gain of >5 pounds. 

## 2021-02-27 ENCOUNTER — Encounter: Payer: Self-pay | Admitting: Family

## 2021-03-02 ENCOUNTER — Other Ambulatory Visit: Payer: Self-pay | Admitting: Gerontology

## 2021-03-02 ENCOUNTER — Other Ambulatory Visit: Payer: Self-pay

## 2021-03-02 ENCOUNTER — Other Ambulatory Visit: Payer: Self-pay | Admitting: Urology

## 2021-03-02 DIAGNOSIS — G8929 Other chronic pain: Secondary | ICD-10-CM

## 2021-03-02 MED ORDER — TADALAFIL 10 MG PO TABS
ORAL_TABLET | ORAL | 0 refills | Status: DC
  Filled 2021-03-02: qty 10, 30d supply, fill #0

## 2021-03-03 ENCOUNTER — Other Ambulatory Visit: Payer: Self-pay | Admitting: Gerontology

## 2021-03-03 ENCOUNTER — Other Ambulatory Visit: Payer: Self-pay

## 2021-03-03 DIAGNOSIS — G8929 Other chronic pain: Secondary | ICD-10-CM

## 2021-03-05 ENCOUNTER — Other Ambulatory Visit: Payer: Self-pay

## 2021-03-06 ENCOUNTER — Other Ambulatory Visit: Payer: Self-pay

## 2021-03-06 ENCOUNTER — Ambulatory Visit: Payer: Medicaid Other | Admitting: Pharmacist

## 2021-03-06 ENCOUNTER — Encounter: Payer: Self-pay | Admitting: Pharmacist

## 2021-03-06 DIAGNOSIS — Z79899 Other long term (current) drug therapy: Secondary | ICD-10-CM

## 2021-03-06 NOTE — Progress Notes (Addendum)
Medication Management Clinic Visit Note  Patient: Matthew Marquez MRN: [id number] Date of Birth: November 09, 1970 PCP: Rolm Gala, NP   Marland Mcalpine Sherbert 50 y.o. male presents for a medication therapy management visit today via telephone.Patient was verified by name and DOB. Pt appears to be in good spirits and endorses he has a job interview today. Pt discussed barriers with transportation but was encouraged to remain in good spirits. I also made pt aware of new [employer] that was having a hiring event next Monday and Tuesday.   There were no vitals taken for this visit.  Patient Information   Past Medical History:  Diagnosis Date   Acid reflux    Asthma    Bruising    Cervicalgia    Chronic headaches    Congestive heart failure (HCC)    pt unsure if correct dx   Hypertension    Hypoglycemia       Past Surgical History:  Procedure Laterality Date   MOUTH SURGERY       Family History  Problem Relation Age of Onset   Cancer Mother    Hyperlipidemia Mother    Hypertension Mother    Diabetes Maternal Grandmother    Hypertension Maternal Grandmother    Suicidality Father    Edema Sister        LE   Cancer Brother     Family Support: Good, patient appears to be close with siblings. States they allow him to use their car when he needs to get places.   Lifestyle Diet: Pt endorses he does not eat a lot. States his diet consist of fried/baked foods. Some veggies and states he likes oranges and bananas. Verbalizes he drinks water and soda but tries to drink soda only when he eats dinner.  Exercise: States he enjoys working on his land. 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    Social History   Substance and Sexual Activity  Alcohol Use Not Currently   Alcohol/week: 1.0 standard drink   Types: 1 Cans of beer per week   Comment: social, last use 2010      Social History   Tobacco Use  Smoking Status Former   Types: Cigarettes   Quit date:  2015   Years since quitting: 7.5  Smokeless Tobacco Never      Health Maintenance  Topic Date Due   COVID-19 Vaccine (1) Never done   Pneumococcal Vaccine 72-67 Years old (1 - PCV) Never done   HIV Screening  Never done   Hepatitis C Screening  Never done   TETANUS/TDAP  Never done   Zoster Vaccines- Shingrix (1 of 2) Never done   COLONOSCOPY (Pts 45-87yrs Insurance coverage will need to be confirmed)  Never done   INFLUENZA VACCINE  03/16/2021   HPV VACCINES  Aged Out   Health Maintenance/Date Completed  Last ED visit: --  Last Visit to PCP: 02/11/21 Next Visit to PCP: 03/18/2021 Specialist Visit: [facility] 02/26/21 Dental Exam: hasn't been, but states he needs a referral. Has several loose teeth and wants them pulled. Eye Exam: hasn't been, but states he needs to go because he wears glasses. Would appreciate a referral.  Prostate Exam: states he had one years ago  Colonoscopy: no and he is not willing.  Flu Vaccine: has had a vaccination in the past but states its been a long time since he has had one  Pneumonia Vaccine: no  COVID-19 Vaccine: no Shingrix Vaccine: no  Outpatient Encounter Medications as of 03/06/2021  Medication Sig   albuterol (VENTOLIN HFA) 108 (90 Base) MCG/ACT inhaler Inhale 2 puffs into the lungs every 6 (six) hours as needed for wheezing or shortness of breath.   atorvastatin (LIPITOR) 20 MG tablet Take 1 tablet (20 mg total) by mouth once daily.   carvedilol (COREG) 6.25 MG tablet Take 1 tablet (6.25 mg total) by mouth 2 (two) times daily with a meal.   chlorthalidone (HYGROTON) 25 MG tablet Take 1 tablet (25 mg total) by mouth once daily.   losartan (COZAAR) 25 MG tablet Take 1 tablet (25 mg total) by mouth once daily.   naproxen (NAPROSYN) 500 MG tablet Take 1 tablet (500 mg total) by mouth 2 (two) times daily with a meal.   omeprazole (PRILOSEC) 20 MG capsule Take 1 capsule (20 mg total) by mouth daily.   SUMAtriptan (IMITREX) 50 MG  tablet Take 1 tablet (50 mg total) by mouth once for 1 dose. May repeat in 2 hours if headache persists or recurs.   tadalafil (CIALIS) 10 MG tablet Take 2 tablets (20mg  total) by mouth 1 hour prior to sexual activity.   [DISCONTINUED] atorvastatin (LIPITOR) 20 MG tablet Take 1 tablet (20 mg total) by mouth daily.   [DISCONTINUED] carvedilol (COREG) 6.25 MG tablet Take 1 tablet (6.25 mg total) by mouth 2 (two) times daily with a meal.   [DISCONTINUED] chlorthalidone (HYGROTON) 25 MG tablet Take 1 tablet (25 mg total) by mouth daily.   [DISCONTINUED] losartan (COZAAR) 25 MG tablet Take 1 tablet (25 mg total) by mouth daily.   [DISCONTINUED] tadalafil (CIALIS) 10 MG tablet Take 2 tablets (20mg  total) by mouth 1 hour prior to sexual activity.   No facility-administered encounter medications on file as of 03/06/2021.     Assessment and Plan:  Medication Adherence/Management:  Pt is able to verbalize medication regimen and endorses he he misses a dose of medication once in a while. Only concern today is pt would like refill on Naproxen. Made pt aware that [facility] had already sent refill request to doctor. Pt was also told he could buy Aleve OTC at a lower strength. Medications are being taken care of by [facility]ication Management Clinic.   Congestive Heart Failure:  Currently on losartan, chlorthalidone and carvedilol. Pt endorses he is adhering to cardiology's recommendations of weighing himself daily. Pt endorses he has gained ~4lbs within one week. States swelling in his hands/feet/legs is pretty normal. Recommended pt reach out to cardiology.   Hypertension:  Same regimen as above. Denies dizziness, headaches, chest pain or changes in vision. States he checks BP in the morning when he wakes up and at night before bedtime. Also endorses his readings "jump around." Recommended pt to take BP when he has been resting for a while. Home BP ranges: 120s-180s/100s mmHg. At last visit with cardiology on 02/26/21 BP  was 122/84 mmHg.    Asthma:  Currently on ventolin. Pt endorses SOB has improved since last reported to PCP. States he is only SOB after eating. Also states he used to have racing HR when laying down after being rested for 1hr+. Endorses this has resolved and "doesn't happen as often as it used to." Followed by: [facility], NP.   Hyperlipidemia: Currently on atorvastatin. Only pain pt endorsed was in his ankle,hence the request for Naproxen. No ADEs of muscle pain. Pt lipid panel on 01/21/21 was WNL excluding HDL 39 mg/dL. Followed by: Chioma Iloabachie,NP.   GERD:  Currently on omeprazole.  Doesn't endorse any signs/symptoms from GERD. Followed by: Eulogio Bear, NP.   Headaches:  Currently on sumatriptan. Pt verbalizes he has not taken this medication in a while because he hasn't had any episodes of headaches. Followed by: Eulogio Bear, NP.   Erectile Dysfunction:   Currently on tadalafil. During encounter pt verbalizes he is not currently sexually active. Has tried sildenafil in the past but states it didn't work per Urology note on 01/28/21. Seen by: Dr. Irineo Axon, MD   FU with clinic in 1 year.   M. [employer], BS PharmD Candidate [phone]  [employer]    Cosigned: Iona Beard. Joelene Millin, PharmD [facility] Clinic-Pharmacy Operations Coordinator [phone]

## 2021-03-18 ENCOUNTER — Ambulatory Visit: Payer: Medicaid Other | Admitting: Gerontology

## 2021-03-26 NOTE — Progress Notes (Deleted)
Patient ID: Matthew Marquez, male    DOB: 1971-07-21, 50 y.o.   MRN: [id number]  HPI  Matthew Marquez is a 50 y/o male with a history of asthma, HTN, cervicalgia and chronic heart failure.   No echo has been done.   Has not been admitted or been in the ED in the last 6 months.   He presents today for a follow-up visit with a chief complaint of   Past Medical History:  Diagnosis Date   Acid reflux    Asthma    Bruising    Cervicalgia    Chronic headaches    Congestive heart failure (HCC)    pt unsure if correct dx   Hypertension    Hypoglycemia    Past Surgical History:  Procedure Laterality Date   MOUTH SURGERY     Family History  Problem Relation Age of Onset   Cancer Mother    Hyperlipidemia Mother    Hypertension Mother    Diabetes Maternal Grandmother    Hypertension Maternal Grandmother    Suicidality Father    Edema Sister        LE   Cancer Brother    Social History   Tobacco Use   Smoking status: Former    Types: Cigarettes    Quit date: 2015    Years since quitting: 7.6   Smokeless tobacco: Never  Substance Use Topics   Alcohol use: Not Currently    Alcohol/week: 1.0 standard drink    Types: 1 Cans of beer per week    Comment: social, last use 2010   No Known Allergies    Review of Systems  Constitutional:  Positive for fatigue. Negative for appetite change.  HENT:  Negative for congestion, postnasal drip and sore throat.   Eyes: Negative.   Respiratory:  Positive for chest tightness (when short of breath) and shortness of breath (easily). Negative for cough and wheezing.   Cardiovascular:  Positive for palpitations and leg swelling. Negative for chest pain.  Gastrointestinal:  Negative for abdominal distention and abdominal pain.  Endocrine: Negative.   Genitourinary: Negative.   Musculoskeletal:  Positive for arthralgias (right ankle; just twisted it earlier), back pain and neck pain.  Skin: Negative.   Allergic/Immunologic: Negative.    Neurological:  Positive for weakness (right ankle) and light-headedness. Negative for dizziness.  Hematological:  Negative for adenopathy. Does not bruise/bleed easily.  Psychiatric/Behavioral:  Positive for sleep disturbance (sleeping on 1-2 pillows). Negative for dysphoric mood. The patient is nervous/anxious.      Physical Exam Vitals and nursing note reviewed.  Constitutional:      Appearance: Normal appearance.  HENT:     Head: Normocephalic and atraumatic.  Cardiovascular:     Rate and Rhythm: Normal rate and regular rhythm.  Pulmonary:     Effort: Pulmonary effort is normal. No respiratory distress.     Breath sounds: No wheezing or rales.  Abdominal:     General: There is no distension.     Palpations: Abdomen is soft.     Tenderness: There is no abdominal tenderness.  Musculoskeletal:        General: No tenderness.     Cervical back: Normal range of motion and neck supple.     Right lower leg: Edema (trace pitting) present.     Left lower leg: Edema (trace pitting) present.  Skin:    General: Skin is warm and dry.  Neurological:     General: No focal deficit present.  Mental Status: He is alert and oriented to person, place, and time.  Psychiatric:        Mood and Affect: Mood is anxious.        Behavior: Behavior normal.        Thought Content: Thought content normal.    Assessment & Plan:  1: Chronic heart failure with preserved ejection fraction- - NYHA class III - euvolemic today - weighing daily; reminded to call for an overnight weight gain of > 2 pounds or a weekly weight gain of > 5 pounds - weight 218.2 pounds from last visit here 1 month ago - not adding salt to his food although he has been staying with his niece this week and knows that he's eaten more salt this week than usual; low sodium cookbook given to him - echo to be done later today - BNP 02/02/18 was 41.9  2: HTN- - BP  - saw PCP at [facility] 02/11/21 - CMP 01/21/21 reviewed and  showed sodium 141, potassium 3.4, creatinine 1.12 and GFR 80   Medication bottles reviewed.

## 2021-03-27 ENCOUNTER — Ambulatory Visit: Payer: Medicaid Other

## 2021-03-27 ENCOUNTER — Ambulatory Visit: Payer: Medicaid Other | Admitting: Family

## 2021-03-27 ENCOUNTER — Telehealth: Payer: Self-pay | Admitting: Family

## 2021-03-27 NOTE — Telephone Encounter (Signed)
Patient did not show for his [facility] appointment on 03/27/21. Will attempt to reschedule.

## 2021-04-01 ENCOUNTER — Other Ambulatory Visit: Payer: Self-pay

## 2021-04-01 ENCOUNTER — Other Ambulatory Visit: Payer: Self-pay | Admitting: Gerontology

## 2021-04-01 DIAGNOSIS — I10 Essential (primary) hypertension: Secondary | ICD-10-CM

## 2021-04-01 MED ORDER — LOSARTAN POTASSIUM 25 MG PO TABS
25.0000 mg | ORAL_TABLET | Freq: Every day | ORAL | 0 refills | Status: DC
  Filled 2021-04-01: qty 7, 7d supply, fill #0

## 2021-04-02 ENCOUNTER — Other Ambulatory Visit: Payer: Self-pay

## 2021-04-04 ENCOUNTER — Other Ambulatory Visit: Payer: Self-pay | Admitting: Urology

## 2021-04-06 ENCOUNTER — Other Ambulatory Visit: Payer: Self-pay

## 2021-04-06 MED ORDER — TADALAFIL 10 MG PO TABS
ORAL_TABLET | ORAL | 0 refills | Status: DC
  Filled 2021-04-06: qty 10, 30d supply, fill #0

## 2021-04-09 ENCOUNTER — Ambulatory Visit: Payer: Medicaid Other | Admitting: Unknown Physician Specialty

## 2021-04-09 ENCOUNTER — Other Ambulatory Visit: Payer: Self-pay

## 2021-04-09 VITALS — BP 128/84 | HR 71 | Resp 18 | Ht 75.0 in | Wt 215.9 lb

## 2021-04-09 DIAGNOSIS — I10 Essential (primary) hypertension: Secondary | ICD-10-CM

## 2021-04-09 DIAGNOSIS — I5022 Chronic systolic (congestive) heart failure: Secondary | ICD-10-CM

## 2021-04-09 MED ORDER — DICLOFENAC SODIUM 1 % EX GEL
2.0000 g | Freq: Four times a day (QID) | CUTANEOUS | 3 refills | Status: DC
  Filled 2021-04-09: qty 100, 13d supply, fill #0

## 2021-04-09 NOTE — Patient Instructions (Signed)
Heart Failure Eating Plan Heart failure, also called congestive heart failure, occurs when your heart does not pump blood well enough to meet your body's needs for oxygen-rich blood. Heart failure is a long-term (chronic) condition. Living with heart failure can be challenging. Following your health care provider's instructions about a healthy lifestyle and working with a dietitian to choose the right foods may help to improve your symptoms. An eating plan for someone with heart failure will include changes that limit the intake of salt (sodium) and unhealthy fat. What are tips for following this plan? Reading food labels Check food labels for the amount of sodium per serving. Choose foods that have less than 140 mg (milligrams) of sodium in each serving. Check food labels for the number of calories per serving. This is important if you need to limit your daily calorie intake to lose weight. Check food labels for the serving size. If you eat more than one serving, you will be eating more sodium and calories than what is listed on the label. Look for foods that are labeled as "sodium-free," "very low sodium," or "low sodium." Foods labeled as "reduced sodium" or "lightly salted" may still have more sodium than what is recommended for you. Cooking Avoid adding salt when cooking. Ask your health care provider or dietitian before using salt substitutes. Season food with salt-free seasonings, spices, or herbs. Check the label of seasoning mixes to make sure they do not contain salt. Cook with heart-healthy oils, such as olive, canola, soybean, or sunflower oil. Do not fry foods. Cook foods using low-fat methods, such as baking, boiling, grilling, and broiling. Limit unhealthy fats when cooking by: Removing the skin from poultry, such as chicken. Removing all visible fats from meats. Skimming the fat off from stews, soups, and gravies before serving them. Meal planning  Limit your intake  of: Processed, canned, or prepackaged foods. Foods that are high in trans fat, such as fried foods. Sweets, desserts, sugary drinks, and other foods with added sugar. Full-fat dairy products, such as whole milk. Eat a balanced diet. This may include: 4-5 servings of fruit each day and 4-5 servings of vegetables each day. At each meal, try to fill one-half of your plate with fruits and vegetables. Up to 6-8 servings of whole grains each day. Up to 2 servings of lean meat, poultry, or fish each day. One serving of meat is equal to 3 oz (85 g). This is about the same size as a deck of cards. 2 servings of low-fat dairy each day. Heart-healthy fats. Healthy fats called omega-3 fatty acids are found in foods such as flaxseed and cold-water fish like sardines, salmon, and mackerel. Aim to eat 25-35 g (grams) of fiber a day. Foods that are high in fiber include apples, broccoli, carrots, beans, peas, and whole grains. Do not add salt or condiments that contain salt (such as soy sauce) to foods before eating. When eating at a restaurant, ask that your food be prepared with less salt or no salt, if possible. Try to eat 2 or more vegetarian meals each week. Eat more home-cooked food and eat less restaurant, buffet, and fast food. General information Do not eat more than 2,300 mg of sodium a day. The amount of sodium that is recommended for you may be lower, depending on your condition. Maintain a healthy body weight as directed. Ask your health care provider what a healthy weight is for you. Check your weight every day. Work with your health care provider   and dietitian to make a plan that is right for you to lose weight or maintain your current weight. Limit how much fluid you drink. Ask your health care provider or dietitian how much fluid you can have each day. Limit or avoid alcohol as told by your health care provider or dietitian. Recommended foods Fruits All fresh, frozen, and canned fruits.  Dried fruits, such as raisins, prunes, and cranberries. Vegetables All fresh vegetables. Vegetables that are frozen without sauce or added salt. Low-sodium or sodium-free canned vegetables. Grains Bread with less than 80 mg of sodium per slice. Whole-wheat pasta, quinoa, and brown rice. Oats and oatmeal. Barley. Millet. Grits and cream of wheat. Whole-grain and whole-wheat cold cereal. Meats and other protein foods Lean cuts of meat. Skinless chicken and turkey. Fish with high omega-3 fatty acids, such as salmon, sardines, and other cold-water fishes. Eggs. Dried beans, peas, and edamame. Unsalted nuts and nut butters. Dairy Low-fat or nonfat (skim) milk and dried milk. Rice milk, soy milk, and almond milk. Low-fat or nonfat yogurt. Small amounts of reduced-sodium block cheese. Low-sodium cottage cheese. Fats and oils Olive, canola, soybean, flaxseed, avocado, or sunflower oil. Sweets and desserts Applesauce. Granola bars. Sugar-free pudding and gelatin. Frozen fruit bars. Seasoning and other foods Fresh and dried herbs. Lemon or lime juice. Vinegar. Low-sodium ketchup. Salt-free marinades, salad dressings, sauces, and seasonings. The items listed above may not be a complete list of foods and beverages you can eat. Contact a dietitian for more information. Foods to avoid Fruits Fruits that are dried with sodium-containing preservatives. Vegetables Canned vegetables. Frozen vegetables with sauce or seasonings. Creamed vegetables. French fries. Onion rings. Pickled vegetables and sauerkraut. Grains Bread with more than 80 mg of sodium per slice. Hot or cold cereal with more than 140 mg sodium per serving. Salted pretzels and crackers. Prepackaged breadcrumbs. Bagels, croissants, and biscuits. Meats and other protein foods Ribs and chicken wings. Bacon, ham, pepperoni, bologna, salami, and packaged luncheon meats. Hot dogs, bratwurst, and sausage. Canned meat. Smoked meat and fish. Salted nuts  and seeds. Dairy Whole milk, half-and-half, and cream. Buttermilk. Processed cheese, cheese spreads, and cheese curds. Regular cottage cheese. Feta cheese. Shredded cheese. String cheese. Fats and oils Butter, lard, shortening, ghee, and bacon fat. Canned and packaged gravies. Seasoning and other foods Onion salt, garlic salt, table salt, and sea salt. Marinades. Regular salad dressings. Relishes, pickles, and olives. Meat flavorings and tenderizers, and bouillon cubes. Horseradish, ketchup, and mustard. Worcestershire sauce. Teriyaki sauce, soy sauce (including reduced sodium). Hot sauce and Tabasco sauce. Steak sauce, fish sauce, oyster sauce, and cocktail sauce. Taco seasonings. Barbecue sauce. Tartar sauce. The items listed above may not be a complete list of foods and beverages you should avoid. Contact a dietitian for more information. Summary A heart failure eating plan includes changes that limit your intake of sodium and unhealthy fat, and it may help you lose weight or maintain a healthy weight. Your health care provider may also recommend limiting how much fluid you drink. Most people with heart failure should eat no more than 2,300 mg of salt (sodium) a day. The amount of sodium that is recommended for you may be lower, depending on your condition. Contact your health care provider or dietitian before making any major changes to your diet. This information is not intended to replace advice given to you by your health care provider. Make sure you discuss any questions you have with your health care provider. Document Revised: 03/17/2020 Document Reviewed: 03/17/2020   Elsevier Patient Education  2022 [employer] Inc.  

## 2021-04-09 NOTE — Assessment & Plan Note (Signed)
Chronic, stable with BP at goal on recheck.  Recommend she monitor BP at least a few mornings a week at home and document.  DASH diet at home.  Continue current medication regimen and adjust as needed.  Avoid NSAIDs, Voltaren gel ordered which can trial for neck pain vs oral NSAID use.  Labs: BMP in future.  Return in 3 months.

## 2021-04-09 NOTE — Progress Notes (Signed)
Established Patient Office Visit  Subjective:  Patient ID: Matthew Marquez, male    DOB: 09/08/1970  Age: 50 y.o. MRN: [id number]  CC: No chief complaint on file.   HPI [name] presents for follow-up, reports he was told to come into clinic.  HYPERTENSION/HF Continues on Coreg, Hygroton, Atorvastatin, Losartan.   Hypertension status: stable  Satisfied with current treatment? yes Duration of hypertension: chronic BP monitoring frequency:  a few times a week BP range: fluctuates he reports occasional 160 and 190 SBP BP medication side effects:  no Medication compliance: fair compliance, occasionally missed doses Aspirin: no Recurrent headaches: no Visual changes: no Palpitations: no Dyspnea: no Chest pain: no Lower extremity edema: no Dizzy/lightheaded: no   Past Medical History:  Diagnosis Date   Acid reflux    Asthma    Bruising    Cervicalgia    Chronic headaches    Congestive heart failure (HCC)    pt unsure if correct dx   Hypertension    Hypoglycemia     Past Surgical History:  Procedure Laterality Date   MOUTH SURGERY      Family History  Problem Relation Age of Onset   Cancer Mother    Hyperlipidemia Mother    Hypertension Mother    Diabetes Maternal Grandmother    Hypertension Maternal Grandmother    Suicidality Father    Edema Sister        LE   Cancer Brother     Social History   Socioeconomic History   Marital status: Legally Separated    Spouse name: Not on file   Number of children: 3   Years of education: 12   Highest education level: GED or equivalent  Occupational History   Occupation: unemployed  Tobacco Use   Smoking status: Former    Types: Cigarettes    Quit date: 2015    Years since quitting: 7.6   Smokeless tobacco: Never  Vaping Use   Vaping Use: Never used  Substance and Sexual Activity   Alcohol use: Not Currently    Alcohol/week: 1.0 standard drink    Types: 1 Cans of beer per week    Comment:  social, last use 2010   Drug use: Never   Sexual activity: Not Currently  Other Topics Concern   Not on file  Social History Narrative   Pt owns home but there is no power or water. He gets water and takes showers from his brothers house. Cooks on a grill. Right now he drives his brothers motorcycle. States he enjoys "living off the grid". Pt is single   Was on food stamps but can't reapply now. Also mentioned he is currently unemployed but taking his final GED test and looking for a job   Social Determinants of Radio broadcast assistant Strain: Not on file  Food Insecurity: No Food Insecurity   Worried About Charity fundraiser in the Last Year: Never true   Arboriculturist in the Last Year: Never true  Transportation Needs: No Transportation Needs   Lack of Transportation (Medical): No   Lack of Transportation (Non-Medical): No  Physical Activity: Not on file  Stress: Not on file  Social Connections: Not on file  Intimate Partner Violence: Not on file    Outpatient Medications Prior to Visit  Medication Sig Dispense Refill   albuterol (VENTOLIN HFA) 108 (90 Base) MCG/ACT inhaler Inhale 2 puffs into the lungs every 6 (six) hours as needed for  wheezing or shortness of breath. 6.7 g 2   atorvastatin (LIPITOR) 20 MG tablet Take 1 tablet (20 mg total) by mouth once daily. 30 tablet 2   carvedilol (COREG) 6.25 MG tablet Take 1 tablet (6.25 mg total) by mouth 2 (two) times daily with a meal. 60 tablet 2   chlorthalidone (HYGROTON) 25 MG tablet Take 1 tablet (25 mg total) by mouth once daily. 30 tablet 2   losartan (COZAAR) 25 MG tablet Take 1 tablet (25 mg total) by mouth once daily. 7 tablet 0   omeprazole (PRILOSEC) 20 MG capsule Take 1 capsule (20 mg total) by mouth daily. 90 capsule 2   SUMAtriptan (IMITREX) 50 MG tablet Take 1 tablet (50 mg total) by mouth once for 1 dose. May repeat in 2 hours if headache persists or recurs. 10 tablet 0   tadalafil (CIALIS) 10 MG tablet Take 2  tablets ($RemoveB'20mg'fOQpGvMB$  total) by mouth 1 hour prior to sexual activity. 10 tablet 0   naproxen (NAPROSYN) 500 MG tablet Take 1 tablet (500 mg total) by mouth 2 (two) times daily with a meal. (Patient not taking: Reported on 04/09/2021) 60 tablet 0   No facility-administered medications prior to visit.    No Known Allergies  ROS Review of Systems  Constitutional:  Negative for activity change, diaphoresis, fatigue and fever.  Respiratory:  Negative for cough, chest tightness, shortness of breath and wheezing.   Cardiovascular:  Negative for chest pain, palpitations and leg swelling.  Gastrointestinal: Negative.   Neurological: Negative.   Psychiatric/Behavioral: Negative.       Objective:    Physical Exam Vitals and nursing note reviewed.  Constitutional:      General: He is awake. He is not in acute distress.    Appearance: He is well-developed and well-groomed. He is not ill-appearing or toxic-appearing.  HENT:     Head: Normocephalic and atraumatic.  Eyes:     General:        Right eye: No discharge.        Left eye: No discharge.     Conjunctiva/sclera: Conjunctivae normal.     Pupils: Pupils are equal, round, and reactive to light.  Neck:     Thyroid: No thyromegaly.  Cardiovascular:     Rate and Rhythm: Normal rate and regular rhythm.     Heart sounds: Normal heart sounds. No murmur heard.   No gallop.  Pulmonary:     Effort: Pulmonary effort is normal. No accessory muscle usage or respiratory distress.     Breath sounds: Normal breath sounds.  Abdominal:     General: Bowel sounds are normal.     Palpations: Abdomen is soft.  Musculoskeletal:        General: Normal range of motion.     Cervical back: Normal range of motion and neck supple.  Lymphadenopathy:     Cervical: No cervical adenopathy.  Skin:    General: Skin is warm and dry.  Neurological:     Mental Status: He is alert and oriented to person, place, and time.     Deep Tendon Reflexes: Reflexes are normal  and symmetric.  Psychiatric:        Attention and Perception: Attention normal.        Mood and Affect: Mood normal.        Speech: Speech normal.        Behavior: Behavior normal. Behavior is cooperative.        Thought Content: Thought content normal.  BP 128/84 (BP Location: Right Arm, Patient Position: Sitting)   Pulse 71   Resp 18   Ht $R'6\' 3"'KD$  (1.905 m)   Wt 215 lb 14.4 oz (97.9 kg)   SpO2 96%   BMI 26.99 kg/m  Wt Readings from Last 3 Encounters:  04/09/21 215 lb 14.4 oz (97.9 kg)  02/26/21 218 lb 2 oz (98.9 kg)  02/11/21 210 lb 8 oz (95.5 kg)     Health Maintenance Due  Topic Date Due   COVID-19 Vaccine (1) Never done   Pneumococcal Vaccine 52-43 Years old (1 - PCV) Never done   HIV Screening  Never done   Hepatitis C Screening  Never done   TETANUS/TDAP  Never done   Zoster Vaccines- Shingrix (1 of 2) Never done   COLONOSCOPY (Pts 45-8yrs Insurance coverage will need to be confirmed)  Never done   INFLUENZA VACCINE  03/16/2021    There are no preventive care reminders to display for this patient.  Lab Results  Component Value Date   TSH 0.823 01/21/2021   Lab Results  Component Value Date   WBC 7.3 01/21/2021   HGB 14.4 01/21/2021   HCT 42.7 01/21/2021   MCV 88 01/21/2021   PLT 283 01/21/2021   Lab Results  Component Value Date   NA 141 01/21/2021   K 3.4 (L) 01/21/2021   CO2 23 01/21/2021   GLUCOSE 104 (H) 01/21/2021   BUN 20 01/21/2021   CREATININE 1.12 01/21/2021   BILITOT 0.3 01/21/2021   ALKPHOS 72 01/21/2021   AST 17 01/21/2021   ALT 24 01/21/2021   PROT 7.0 01/21/2021   ALBUMIN 4.5 01/21/2021   CALCIUM 9.5 01/21/2021   EGFR 80 01/21/2021   Lab Results  Component Value Date   CHOL 132 01/21/2021   Lab Results  Component Value Date   HDL 39 (L) 01/21/2021   Lab Results  Component Value Date   LDLCALC 70 01/21/2021   Lab Results  Component Value Date   TRIG 131 01/21/2021   Lab Results  Component Value Date   CHOLHDL 3.4  01/21/2021   Lab Results  Component Value Date   HGBA1C 5.6 01/21/2021      Assessment & Plan:   Problem List Items Addressed This Visit       Cardiovascular and Mediastinum   Hypertension    Chronic, stable with BP at goal on recheck.  Recommend she monitor BP at least a few mornings a week at home and document.  DASH diet at home.  Continue current medication regimen and adjust as needed.  Avoid NSAIDs, Voltaren gel ordered which can trial for neck pain vs oral NSAID use.  Labs: BMP in future.  Return in 3 months.       Congestive heart failure (HCC) - Primary    Chronic, stable at this time.  Euvolemic.  BP at goal.  Continue collaboration with HF clinic.  Recommend: - Reminded to call for an overnight weight gain of >2 pounds or a weekly weight gain of >5 pounds - not adding salt to food and read food labels. Reviewed the importance of keeping daily sodium intake to '2000mg'$  daily. - Avoid NSAIDS      Relevant Orders   Basic Metabolic Panel (BMET)    Meds ordered this encounter  Medications   diclofenac Sodium (VOLTAREN) 1 % GEL    Sig: Apply 2 g topically 4 (four) times daily.    Dispense:  100 g    Refill:  3    Follow-up: Return in about 3 months (around 07/10/2021) for HTN/HF.    Venita Lick, NP

## 2021-04-09 NOTE — Assessment & Plan Note (Signed)
Chronic, stable at this time.  Euvolemic.  BP at goal.  Continue collaboration with HF clinic.  Recommend: - Reminded to call for an overnight weight gain of >2 pounds or a weekly weight gain of >5 pounds - not adding salt to food and read food labels. Reviewed the importance of keeping daily sodium intake to 2000mg  daily. - Avoid NSAIDS

## 2021-04-10 ENCOUNTER — Other Ambulatory Visit: Payer: Self-pay

## 2021-04-29 ENCOUNTER — Other Ambulatory Visit: Payer: Self-pay | Admitting: Gerontology

## 2021-04-29 DIAGNOSIS — I10 Essential (primary) hypertension: Secondary | ICD-10-CM

## 2021-04-30 ENCOUNTER — Other Ambulatory Visit: Payer: Self-pay

## 2021-04-30 MED ORDER — LOSARTAN POTASSIUM 25 MG PO TABS
25.0000 mg | ORAL_TABLET | Freq: Every day | ORAL | 2 refills | Status: DC
  Filled 2021-04-30 (×2): qty 30, 30d supply, fill #0

## 2021-07-02 ENCOUNTER — Ambulatory Visit: Payer: Medicaid Other

## 2021-08-17 ENCOUNTER — Other Ambulatory Visit: Payer: Self-pay

## 2021-08-17 ENCOUNTER — Emergency Department
Admission: EM | Admit: 2021-08-17 | Discharge: 2021-08-17 | Disposition: A | Discharge status: Home / Self Care | Payer: BC Managed Care – PPO | Attending: Emergency Medicine | Admitting: Emergency Medicine

## 2021-08-17 ENCOUNTER — Encounter: Payer: Self-pay | Admitting: Emergency Medicine

## 2021-08-17 ENCOUNTER — Emergency Department: Payer: BC Managed Care – PPO

## 2021-08-17 DIAGNOSIS — I10 Essential (primary) hypertension: Secondary | ICD-10-CM

## 2021-08-17 DIAGNOSIS — I509 Heart failure, unspecified: Secondary | ICD-10-CM | POA: Insufficient documentation

## 2021-08-17 DIAGNOSIS — I110 Hypertensive heart disease with heart failure: Secondary | ICD-10-CM | POA: Insufficient documentation

## 2021-08-17 DIAGNOSIS — R0789 Other chest pain: Secondary | ICD-10-CM | POA: Insufficient documentation

## 2021-08-17 LAB — BASIC METABOLIC PANEL
Anion gap: 11 (ref 5–15)
BUN: 14 mg/dL (ref 6–20)
CO2: 23 mmol/L (ref 22–32)
Calcium: 9.3 mg/dL (ref 8.9–10.3)
Chloride: 99 mmol/L (ref 98–111)
Creatinine, Ser: 1.1 mg/dL (ref 0.61–1.24)
GFR, Estimated: >60 mL/min (ref >60)
Glucose, Bld: 107 mg/dL — ABNORMAL HIGH (ref 70–99)
Potassium: 2.9 mmol/L — ABNORMAL LOW (ref 3.5–5.1)
Sodium: 133 mmol/L — ABNORMAL LOW (ref 135–145)

## 2021-08-17 LAB — TROPONIN I (HIGH SENSITIVITY): Troponin I (High Sensitivity): 13 ng/L

## 2021-08-17 LAB — CBC
HCT: 45.7 % (ref 39.0–52.0)
Hemoglobin: 16.2 g/dL (ref 13.0–17.0)
MCH: 30.1 pg (ref 26.0–34.0)
MCHC: 35.4 g/dL (ref 30.0–36.0)
MCV: 84.8 fL (ref 80.0–100.0)
Platelets: 292 K/uL (ref 150–400)
RBC: 5.39 MIL/uL (ref 4.22–5.81)
RDW: 14.9 % (ref 11.5–15.5)
WBC: 7.5 K/uL (ref 4.0–10.5)
nRBC: 0 % (ref 0.0–0.2)

## 2021-08-17 MED ORDER — CARVEDILOL 6.25 MG PO TABS
6.2500 mg | ORAL_TABLET | Freq: Once | ORAL | Status: AC
  Administered 2021-08-17: 6.25 mg via ORAL
  Filled 2021-08-17: qty 1

## 2021-08-17 MED ORDER — CARVEDILOL 12.5 MG PO TABS: 12.5000 mg | ORAL_TABLET | Freq: Two times a day (BID) | ORAL | 2 refills | Status: DC

## 2021-08-17 MED ORDER — CHLORTHALIDONE 25 MG PO TABS
50.0000 mg | ORAL_TABLET | Freq: Every day | ORAL | Status: DC
  Administered 2021-08-17: 50 mg via ORAL
  Filled 2021-08-17: qty 2

## 2021-08-17 MED ORDER — CHLORTHALIDONE 50 MG PO TABS: 50.0000 mg | ORAL_TABLET | Freq: Every day | ORAL | 2 refills | Status: DC

## 2021-08-17 MED ORDER — LORAZEPAM 1 MG PO TABS
1.0000 mg | ORAL_TABLET | Freq: Once | ORAL | Status: AC
Start: 2021-08-17 — End: 2021-08-17
  Administered 2021-08-17: 1 mg via ORAL
  Filled 2021-08-17: qty 1

## 2021-08-17 NOTE — ED Triage Notes (Signed)
Pt via POV from home. Pt states he has been out of his BP medication for about a month for insurance reason. Pt states he had a headache that started 2 weeks ago. Pt is A&OX4 and NAD

## 2021-08-17 NOTE — Discharge Instructions (Signed)
Please call the number provided for [facility] clinic to arrange a primary care appointment.  Please begin taking your blood pressure medications as prescribed.  Return to the emergency department for any chest pain, trouble breathing, or any other symptom personally concerning to yourself.  Please check your blood pressure 2 or 3 times weekly and record these numbers in a log/journal to bring to your primary care doctor.  If your blood pressure drops below 120 on the top number or 80 on the bottom number please speak to your doctor before continuing your blood pressure medication.

## 2021-08-17 NOTE — ED Notes (Signed)
See triage note  present for medication refill  states he was going to [facility]  but now he has insurance  denies any pain or other complaints

## 2021-08-17 NOTE — ED Provider Notes (Signed)
[facility] Provider Note    Event Date/Time   First MD Initiated Contact with Patient 08/17/21 1243     (approximate)  History   Hypertension and Headache  HPI  Matthew Marquez is a 51 y.o. male with a past medical history of CHF and hypertension who presents to the emergency department for high blood pressure.  According to the patient for the past several weeks his blood pressures been running high as he has been out of his blood pressure medications has not been able to see his doctor to refill them due to insurance issues.  Patient states he has been experiencing some intermittent chest pressure at times over the past several weeks and occasional headaches.  Denies any weakness numbness confusion.  Denies any recent illnesses.  Physical Exam   Triage Vital Signs: ED Triage Vitals  Enc Vitals Group     BP 08/17/21 1217 (!) 187/132     Pulse Rate 08/17/21 1217 88     Resp 08/17/21 1217 20     Temp 08/17/21 1217 98.1 F (36.7 C)     Temp Source 08/17/21 1217 Oral     SpO2 08/17/21 1217 94 %     Weight 08/17/21 1218 215 lb (97.5 kg)     Height 08/17/21 1218 6\' 3"  (1.905 m)     Head Circumference --      Peak Flow --      Pain Score 08/17/21 1218 10     Pain Loc --      Pain Edu? --      Excl. in GC? --     Most recent vital signs: Vitals:   08/17/21 1217  BP: (!) 187/132  Pulse: 88  Resp: 20  Temp: 98.1 F (36.7 C)  SpO2: 94%    General: Awake, no distress.  CV:  Good peripheral perfusion.  Regular rate and rhythm  Resp:  Normal effort.  Equal breath sounds bilaterally.  No wheeze rales or rhonchi. Abd:  No distention.  Soft, nontender.  No rebound or guarding.    ED Results / Procedures / Treatments   EKG  EKG viewed and interpreted by myself shows a normal sinus rhythm at 69 bpm with a narrow QRS, normal axis, normal intervals, patient does have lateral T wave inversions.  There are no old EKGs for comparison.   MEDICATIONS  ORDERED IN ED: Medications  carvedilol (COREG) tablet 6.25 mg (has no administration in time range)  chlorthalidone (HYGROTON) tablet 50 mg (has no administration in time range)  LORazepam (ATIVAN) tablet 1 mg (has no administration in time range)     IMPRESSION / MDM / ASSESSMENT AND PLAN / ED COURSE  I reviewed the triage vital signs and the nursing notes.  Patient presents emergency department for high blood pressure over the past several weeks.  Patient has been out of his medication for the past several weeks as well including Coreg and chlorthalidone.  Patient states he has been experiencing intermittent chest tightness described as a mild tightness denies any currently.  Also has been experiencing intermittent headaches moderate dull aching headaches including one currently per patient.  Overall reassuring physical exam.  Given the patient's significant hypertension upon arrival 187/132 we will check labs given the patient's chest tightness we will obtain a cardiac enzyme and EKG.  We will restart the patient's chlorthalidone and Coreg.  We will also dose a one-time dose of Ativan in the emergency department as patient is somewhat  anxious appearing.  Differential this time would include essential hypertension, anxiety, ACS.  EKG does show lateral T wave inversions no old EKGs available for comparison.  Labs including cardiac enzymes are pending.  Patient's lab work is reassuring including renal function as well as a normal troponin.  Patient's blood pressure on recheck is 147/117.  Patient states he is feeling much better.  We will discharge patient home on chlorthalidone we will increase Coreg from 6.25-12.5 and have the patient follow-up with a primary care doctor [facility].  I discussed return precautions.  Patient agreeable to plan of care.   FINAL CLINICAL IMPRESSION(S) / ED DIAGNOSES   Hypertension   Rx / DC Orders   Coreg 12.5 mg for hypertension Chlorthalidone 50 mg for  hypertension   Note:  This document was prepared using Dragon voice recognition software and may include unintentional dictation errors.   Minna Antis, MD 08/17/21 1447

## 2021-09-01 DIAGNOSIS — F4321 Adjustment disorder with depressed mood: Secondary | ICD-10-CM | POA: Diagnosis not present

## 2021-09-15 DIAGNOSIS — F4321 Adjustment disorder with depressed mood: Secondary | ICD-10-CM | POA: Diagnosis not present

## 2021-09-29 DIAGNOSIS — F4321 Adjustment disorder with depressed mood: Secondary | ICD-10-CM | POA: Diagnosis not present

## 2021-10-13 DIAGNOSIS — F4321 Adjustment disorder with depressed mood: Secondary | ICD-10-CM | POA: Diagnosis not present

## 2021-10-27 DIAGNOSIS — F4321 Adjustment disorder with depressed mood: Secondary | ICD-10-CM | POA: Diagnosis not present

## 2021-11-10 ENCOUNTER — Other Ambulatory Visit: Payer: Self-pay

## 2021-11-10 ENCOUNTER — Encounter: Payer: Self-pay | Admitting: Nurse Practitioner

## 2021-11-10 ENCOUNTER — Ambulatory Visit (INDEPENDENT_AMBULATORY_CARE_PROVIDER_SITE_OTHER): Payer: BC Managed Care – PPO | Admitting: Nurse Practitioner

## 2021-11-10 VITALS — BP 121/72 | HR 70 | Temp 97.9°F | Ht 75.2 in | Wt 220.7 lb

## 2021-11-10 DIAGNOSIS — N529 Male erectile dysfunction, unspecified: Secondary | ICD-10-CM

## 2021-11-10 DIAGNOSIS — E782 Mixed hyperlipidemia: Secondary | ICD-10-CM

## 2021-11-10 DIAGNOSIS — F4321 Adjustment disorder with depressed mood: Secondary | ICD-10-CM | POA: Diagnosis not present

## 2021-11-10 DIAGNOSIS — R519 Headache, unspecified: Secondary | ICD-10-CM

## 2021-11-10 DIAGNOSIS — R0789 Other chest pain: Secondary | ICD-10-CM | POA: Diagnosis not present

## 2021-11-10 DIAGNOSIS — Z6827 Body mass index (BMI) 27.0-27.9, adult: Secondary | ICD-10-CM | POA: Insufficient documentation

## 2021-11-10 DIAGNOSIS — K219 Gastro-esophageal reflux disease without esophagitis: Secondary | ICD-10-CM | POA: Diagnosis not present

## 2021-11-10 DIAGNOSIS — I10 Essential (primary) hypertension: Secondary | ICD-10-CM

## 2021-11-10 DIAGNOSIS — Z7689 Persons encountering health services in other specified circumstances: Secondary | ICD-10-CM

## 2021-11-10 DIAGNOSIS — G8929 Other chronic pain: Secondary | ICD-10-CM

## 2021-11-10 MED ORDER — TADALAFIL 10 MG PO TABS: ORAL_TABLET | ORAL | 2 refills | Status: DC

## 2021-11-10 MED ORDER — ATORVASTATIN CALCIUM 20 MG PO TABS: 20.0000 mg | ORAL_TABLET | Freq: Every day | ORAL | 1 refills | Status: DC

## 2021-11-10 MED ORDER — CARVEDILOL 12.5 MG PO TABS: 12.5000 mg | ORAL_TABLET | Freq: Two times a day (BID) | ORAL | 3 refills | Status: DC

## 2021-11-10 MED ORDER — OMEPRAZOLE 20 MG PO CPDR: 20.0000 mg | DELAYED_RELEASE_CAPSULE | Freq: Every day | ORAL | 1 refills | Status: DC

## 2021-11-10 MED ORDER — LOSARTAN POTASSIUM 25 MG PO TABS: 25.0000 mg | ORAL_TABLET | Freq: Every day | ORAL | 1 refills | Status: DC

## 2021-11-10 MED ORDER — CHLORTHALIDONE 50 MG PO TABS: 50.0000 mg | ORAL_TABLET | Freq: Every day | ORAL | 1 refills | Status: DC

## 2021-11-10 MED ORDER — SUMATRIPTAN SUCCINATE 50 MG PO TABS: ORAL_TABLET | ORAL | 2 refills | Status: DC

## 2021-11-10 NOTE — Progress Notes (Signed)
Reviewed with patient during visit

## 2021-11-10 NOTE — Patient Instructions (Signed)
Fat and Cholesterol Restricted Eating Plan Getting too much fat and cholesterol in your diet may cause health problems. Choosing the right foods helps keep your fat and cholesterol at normal levels. This can keep you from getting certain diseases. Your doctor may recommend an eating plan that includes: Total fat: ______% or less of total calories a day. This is ______g of fat a day. Saturated fat: ______% or less of total calories a day. This is ______g of saturated fat a day. Cholesterol: less than _________mg a day. Fiber: ______g a day. What are tips for following this plan? General tips Work with your doctor to lose weight if you need to. Avoid: Foods with added sugar. Fried foods. Foods with trans fat or partially hydrogenated oils. This includes some margarines and baked goods. If you drink alcohol: Limit how much you have to: 0-1 drink a day for women who are not pregnant. 0-2 drinks a day for men. Know how much alcohol is in a drink. In the [place] one drink equals one 12 oz bottle of beer (355 mL), one 5 oz glass of wine (148 mL), or one 1 oz glass of hard liquor (44 mL). Reading food labels Check food labels for: Trans fats. Partially hydrogenated oils. Saturated fat (g) in each serving. Cholesterol (mg) in each serving. Fiber (g) in each serving. Choose foods with healthy fats, such as: Monounsaturated fats and polyunsaturated fats. These include olive and canola oil, flaxseeds, walnuts, almonds, and seeds. Omega-3 fats. These are found in certain fish, flaxseed oil, and ground flaxseeds. Choose grain products that have whole grains. Look for the word "whole" as the first word in the ingredient list. Cooking Cook foods using low-fat methods. These include baking, boiling, grilling, and broiling. Eat more home-cooked foods. Eat at restaurants and buffets less often. Eat less fast food. Avoid cooking using saturated fats, such as butter, cream, palm oil, palm kernel oil, and  coconut oil. Meal planning  At meals, divide your plate into four equal parts: Fill one-half of your plate with vegetables, green salads, and fruit. Fill one-fourth of your plate with whole grains. Fill one-fourth of your plate with low-fat (lean) protein foods. Eat fish that is high in omega-3 fats at least two times a week. This includes mackerel, tuna, sardines, and salmon. Eat foods that are high in fiber, such as whole grains, beans, apples, pears, berries, broccoli, carrots, peas, and barley. What foods should I eat? Fruits All fresh, canned (in natural juice), or frozen fruits. Vegetables Fresh or frozen vegetables (raw, steamed, roasted, or grilled). Green salads. Grains Whole grains, such as whole wheat or whole grain breads, crackers, cereals, and pasta. Unsweetened oatmeal, bulgur, barley, quinoa, or brown rice. Corn or whole wheat flour tortillas. Meats and other protein foods Ground beef (85% or leaner), grass-fed beef, or beef trimmed of fat. Skinless chicken or turkey. Ground chicken or turkey. Pork trimmed of fat. All fish and seafood. Egg whites. Dried beans, peas, or lentils. Unsalted nuts or seeds. Unsalted canned beans. Nut butters without added sugar or oil. Dairy Low-fat or nonfat dairy products, such as skim or 1% milk, 2% or reduced-fat cheeses, low-fat and fat-free ricotta or cottage cheese, or plain low-fat and nonfat yogurt. Fats and oils Tub margarine without trans fats. Light or reduced-fat mayonnaise and salad dressings. Avocado. Olive, canola, sesame, or safflower oils. The items listed above may not be a complete list of foods and beverages you can eat. Contact a dietitian for more information. What foods   should I avoid? Fruits Canned fruit in heavy syrup. Fruit in cream or butter sauce. Fried fruit. Vegetables Vegetables cooked in cheese, cream, or butter sauce. Fried vegetables. Grains White bread. White pasta. White rice. Cornbread. Bagels, pastries,  and croissants. Crackers and snack foods that contain trans fat and hydrogenated oils. Meats and other protein foods Fatty cuts of meat. Ribs, chicken wings, bacon, sausage, bologna, salami, chitterlings, fatback, hot dogs, bratwurst, and packaged lunch meats. Liver and organ meats. Whole eggs and egg yolks. Chicken and turkey with skin. Fried meat. Dairy Whole or 2% milk, cream, half-and-half, and cream cheese. Whole milk cheeses. Whole-fat or sweetened yogurt. Full-fat cheeses. Nondairy creamers and whipped toppings. Processed cheese, cheese spreads, and cheese curds. Fats and oils Butter, stick margarine, lard, shortening, ghee, or bacon fat. Coconut, palm kernel, and palm oils. Beverages Alcohol. Sugar-sweetened drinks such as sodas, lemonade, and fruit drinks. Sweets and desserts Corn syrup, sugars, honey, and molasses. Candy. Jam and jelly. Syrup. Sweetened cereals. Cookies, pies, cakes, donuts, muffins, and ice cream. The items listed above may not be a complete list of foods and beverages you should avoid. Contact a dietitian for more information. Summary Choosing the right foods helps keep your fat and cholesterol at normal levels. This can keep you from getting certain diseases. At meals, fill one-half of your plate with vegetables, green salads, and fruits. Eat high fiber foods, like whole grains, beans, apples, pears, berries, carrots, peas, and barley. Limit added sugar, saturated fats, alcohol, and fried foods. This information is not intended to replace advice given to you by your health care provider. Make sure you discuss any questions you have with your health care provider. Document Revised: 12/12/2020 Document Reviewed: 12/12/2020 Elsevier Patient Education  2022 Elsevier Inc.  

## 2021-11-10 NOTE — Progress Notes (Signed)
? ?New Patient Office Visit ? ?Subjective:  ?Patient ID: Matthew Marquez, male    DOB: 07/06/1971  Age: 51 y.o. MRN: [id number] ? ?CC:  ?Chief Complaint  ?Patient presents with  ? New Patient (Initial Visit)  ? ? ?HPI ?Matthew Marquez presents to establish new primary care provider ?-history of hypertension. Has been out of fluid pill for the last month or longer.  ?-feeling chest tightness for past four nights. Feels like this is because he is without fluid pill.  ?-has seen cardiology in the past as ECG done 08/2020 did show ischemia.  ?-has had congestive heart failure in the past. ? ? ?Past Medical History:  ?Diagnosis Date  ? Acid reflux   ? Asthma   ? Bruising   ? Cervicalgia   ? Chronic headaches   ? Congestive heart failure (HCC)   ? pt unsure if correct dx  ? Hypertension   ? Hypoglycemia   ? ? ?Past Surgical History:  ?Procedure Laterality Date  ? MOUTH SURGERY    ? ? ?Family History  ?Problem Relation Age of Onset  ? Cancer Mother   ? Hyperlipidemia Mother   ? Hypertension Mother   ? Diabetes Maternal Grandmother   ? Hypertension Maternal Grandmother   ? Suicidality Father   ? Edema Sister   ?     LE  ? Cancer Brother   ? ? ?Social History  ? ?Socioeconomic History  ? Marital status: Legally Separated  ?  Spouse name: Not on file  ? Number of children: 3  ? Years of education: 46  ? Highest education level: GED or equivalent  ?Occupational History  ? Occupation: unemployed  ?Tobacco Use  ? Smoking status: Former  ?  Types: Cigarettes  ?  Quit date: 2015  ?  Years since quitting: 8.2  ? Smokeless tobacco: Never  ?Vaping Use  ? Vaping Use: Never used  ?Substance and Sexual Activity  ? Alcohol use: Yes  ?  Alcohol/week: 1.0 standard drink  ?  Types: 1 Cans of beer per week  ?  Comment: social, last use 2010  ? Drug use: Never  ? Sexual activity: Yes  ?Other Topics Concern  ? Not on file  ?Social History Narrative  ? Pt owns home but there is no power or water. He gets water and takes showers from his  brothers house. Cooks on a grill. Right now he drives his brothers motorcycle. States he enjoys "living off the grid". Pt is single  ? Was on food stamps but can't reapply now. Also mentioned he is currently unemployed but taking his final GED test and looking for a job  ? ?Social Determinants of Health  ? ?Financial Resource Strain: Not on file  ?Food Insecurity: No Food Insecurity  ? Worried About Programme researcher, broadcasting/film/video in the Last Year: Never true  ? Ran Out of Food in the Last Year: Never true  ?Transportation Needs: No Transportation Needs  ? Lack of Transportation (Medical): No  ? Lack of Transportation (Non-Medical): No  ?Physical Activity: Not on file  ?Stress: Not on file  ?Social Connections: Not on file  ?Intimate Partner Violence: Not on file  ? ? ?ROS ?Review of Systems  ?Constitutional:  Positive for fatigue. Negative for activity change, chills and fever.  ?HENT:  Negative for congestion, postnasal drip, rhinorrhea, sinus pressure, sinus pain, sneezing and sore throat.   ?Eyes: Negative.   ?Respiratory:  Positive for shortness of breath. Negative for cough  and wheezing.   ?Cardiovascular:  Negative for chest pain and palpitations.  ?     Feels tightness in his chest - swelling in hands and feet   ?Gastrointestinal:  Negative for constipation, diarrhea, nausea and vomiting.  ?Endocrine: Negative for cold intolerance, heat intolerance, polydipsia and polyuria.  ?Genitourinary:  Negative for dysuria, frequency and urgency.  ?Musculoskeletal:  Positive for neck pain and neck stiffness. Negative for back pain and myalgias.  ?     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  ?Allergic/Immunologic: Negative for environmental allergies.  ?Neurological:  Negative for dizziness, weakness and headaches.  ?Psychiatric/Behavioral:  The patient is not nervous/anxious.   ? ?Objective:  ? ?Today's Vitals  ? 11/10/21 1401  ?BP: 121/72  ?Pulse: 70  ?Temp: 97.9 ?F  (36.6 ?C)  ?SpO2: 98%  ?Weight: 220 lb 11.2 oz (100.1 kg)  ?Height: 6' 3.2" (1.91 m)  ? ?Body mass index is 27.44 kg/m?.  ? ?Physical Exam ?Vitals and nursing note reviewed.  ?Constitutional:   ?   Appearance: Normal appearance. He is well-developed.  ?HENT:  ?   Head: Normocephalic and atraumatic.  ?   Nose: Nose normal.  ?   Mouth/Throat:  ?   Mouth: Mucous membranes are moist.  ?   Pharynx: Oropharynx is clear.  ?Eyes:  ?   Conjunctiva/sclera: Conjunctivae normal.  ?   Pupils: Pupils are equal, round, and reactive to light.  ?Neck:  ?   Vascular: No carotid bruit.  ?Cardiovascular:  ?   Rate and Rhythm: Normal rate and regular rhythm.  ?   Pulses: Normal pulses.  ?   Heart sounds: Normal heart sounds.  ?   Comments: ECG normal today  ?Pulmonary:  ?   Effort: Pulmonary effort is normal.  ?   Breath sounds: Normal breath sounds.  ?Abdominal:  ?   Palpations: Abdomen is soft.  ?Musculoskeletal:     ?   General: Normal range of motion.  ?   Cervical back: Normal range of motion and neck supple.  ?Lymphadenopathy:  ?   Cervical: No cervical adenopathy.  ?Skin: ?   General: Skin is warm and dry.  ?   Capillary Refill: Capillary refill takes less than 2 seconds.  ?Neurological:  ?   General: No focal deficit present.  ?   Mental Status: He is alert and oriented to person, place, and time.  ?Psychiatric:     ?   Mood and Affect: Mood normal.     ?   Behavior: Behavior normal.     ?   Thought Content: Thought content normal.     ?   Judgment: Judgment normal.  ? ? ?Assessment & Plan:  ?1. Tightness in chest ?EKG today is normal. ?- EKG 12-Lead ? ?2. Primary hypertension ?Blood pressure stable.  History continue current and chlorthalidone as prescribed.  Refills sent to pharmacy today. ?- carvedilol (COREG) 12.5 MG tablet; Take 1 tablet (12.5 mg total) by mouth 2 (two) times daily.  Dispense: 180 tablet; Refill: 3 ?- chlorthalidone (HYGROTON) 50 MG tablet; Take 1 tablet (50 mg total) by mouth daily.  Dispense: 30 tablet;  Refill: 1 ?- losartan (COZAAR) 25 MG tablet; Take 1 tablet (25 mg total) by mouth once daily.  Dispense: 90 tablet; Refill: 1 ? ?3. Mixed hyperlipidemia ?Continue atorvastatin as prescribed. ?- atorvastatin (LIPITOR) 20 MG tablet; Take 1 tablet (20 mg total) by mouth once daily.  Dispense: 90 tablet; Refill: 1 ? ?4. Gastroesophageal reflux disease without esophagitis ?Patient continue omeprazole 20 mg daily. ?- omeprazole (PRILOSEC) 20 MG capsule; Take 1 capsule (20 mg total) by mouth daily.  Dispense: 90 capsule; Refill: 1 ? ?5. Body mass index 27.0-27.9, adult ?Encourage patient to limit calorie intake to 2000 cal/day or less.  He should consume a low cholesterol, low-fat diet.  Recommend he incorporate exercise into his daily routine.  ? ?6. Erectile dysfunction, unspecified erectile dysfunction type ?Refill cialis 10mg  tablets today  ?- tadalafil (CIALIS) 10 MG tablet; Take 2 tablets (20mg  total) by mouth 1 hour prior to sexual activity.  Dispense: 10 tablet; Refill: 2 ? ?7. Chronic nonintractable headache, unspecified headache type ?May take imitrex as needed and as indicated for acute migraine headache.  ?- SUMAtriptan (IMITREX) 50 MG tablet; May repeat in 2 hours if headache persists or recurs.  Dispense: 10 tablet; Refill: 2 ? ?8. Encounter to establish care ?Appointment today to establish new primary care provider   ? ?Problem List Items Addressed This Visit   ? ?  ? Cardiovascular and Mediastinum  ? Hypertension  ? Relevant Medications  ? atorvastatin (LIPITOR) 20 MG tablet  ? carvedilol (COREG) 12.5 MG tablet  ? chlorthalidone (HYGROTON) 50 MG tablet  ? losartan (COZAAR) 25 MG tablet  ? tadalafil (CIALIS) 10 MG tablet  ?  ? Digestive  ? Acid reflux  ? Relevant Medications  ? omeprazole (PRILOSEC) 20 MG capsule  ?  ? Other  ? Chronic headaches  ? Relevant Medications  ? carvedilol (COREG) 12.5 MG tablet  ? SUMAtriptan (IMITREX) 50 MG tablet  ? Hyperlipidemia  ? Relevant Medications  ? atorvastatin  (LIPITOR) 20 MG tablet  ? carvedilol (COREG) 12.5 MG tablet  ? chlorthalidone (HYGROTON) 50 MG tablet  ? losartan (COZAAR) 25 MG tablet  ? tadalafil (CIALIS) 10 MG tablet  ? Encounter to establish care  ? Erectile

## 2021-12-08 DIAGNOSIS — F4321 Adjustment disorder with depressed mood: Secondary | ICD-10-CM | POA: Diagnosis not present

## 2021-12-17 ENCOUNTER — Other Ambulatory Visit: Payer: Self-pay

## 2021-12-22 DIAGNOSIS — F4321 Adjustment disorder with depressed mood: Secondary | ICD-10-CM | POA: Diagnosis not present

## 2022-01-19 ENCOUNTER — Other Ambulatory Visit: Payer: Self-pay

## 2022-01-19 DIAGNOSIS — I10 Essential (primary) hypertension: Secondary | ICD-10-CM

## 2022-01-19 DIAGNOSIS — F4321 Adjustment disorder with depressed mood: Secondary | ICD-10-CM | POA: Diagnosis not present

## 2022-01-19 MED ORDER — CHLORTHALIDONE 50 MG PO TABS: 50.0000 mg | ORAL_TABLET | Freq: Every day | ORAL | 1 refills | Status: DC

## 2022-02-02 DIAGNOSIS — F4321 Adjustment disorder with depressed mood: Secondary | ICD-10-CM | POA: Diagnosis not present

## 2022-02-10 NOTE — Progress Notes (Signed)
Established patient visit   Patient: Matthew Marquez   DOB: 05-Aug-1971   51 y.o. Male  MRN: [id number] Visit Date: 02/11/2022   Chief Complaint  Patient presents with   Annual Exam   Subjective    HPI  Annual physical  - history of CAD - sees cardiology routinely  -due to have routine, fasting labs --will add Hep C screen and HIV if patient wants  -due to have screening colonoscopy or cologuard.  -would like to get Tdap -states that he had a few days with "anger issues."  States that this is not usual for him. Does have increased frustration related to work. Has happened twice in the past few months.  -feels tired all the time. Does snore. Sometimes snoring is loud.  -patient does have a few palpable lumps in the right lower abdomen which are non tender. They haven't changed over the course of several years.   Medications: Outpatient Medications Prior to Visit  Medication Sig   carvedilol (COREG) 12.5 MG tablet Take 1 tablet (12.5 mg total) by mouth 2 (two) times daily.   diclofenac Sodium (VOLTAREN) 1 % GEL Apply 2g topically to the affected area(s) 4 (four) times daily.   [DISCONTINUED] atorvastatin (LIPITOR) 20 MG tablet Take 1 tablet (20 mg total) by mouth once daily.   [DISCONTINUED] chlorthalidone (HYGROTON) 50 MG tablet Take 1 tablet (50 mg total) by mouth daily.   [DISCONTINUED] losartan (COZAAR) 25 MG tablet Take 1 tablet (25 mg total) by mouth once daily.   [DISCONTINUED] omeprazole (PRILOSEC) 20 MG capsule Take 1 capsule (20 mg total) by mouth daily.   [DISCONTINUED] SUMAtriptan (IMITREX) 50 MG tablet May repeat in 2 hours if headache persists or recurs.   [DISCONTINUED] tadalafil (CIALIS) 10 MG tablet Take 2 tablets (20mg  total) by mouth 1 hour prior to sexual activity.   albuterol (VENTOLIN HFA) 108 (90 Base) MCG/ACT inhaler Inhale 2 puffs into the lungs every 6 (six) hours as needed for wheezing or shortness of breath. (Patient not taking: Reported on 11/10/2021)    No facility-administered medications prior to visit.    Review of Systems  Constitutional:  Positive for fatigue. Negative for activity change, chills and fever.  HENT:  Negative for congestion, postnasal drip, rhinorrhea, sinus pressure, sinus pain, sneezing and sore throat.   Eyes: Negative.   Respiratory:  Negative for cough, shortness of breath and wheezing.   Cardiovascular:  Negative for chest pain and palpitations.  Gastrointestinal:  Negative for constipation, diarrhea, nausea and vomiting.  Endocrine: Negative for cold intolerance, heat intolerance, polydipsia and polyuria.  Genitourinary:  Negative for dysuria, frequency and urgency.  Musculoskeletal:  Negative for back pain and myalgias.  Skin:  Negative for rash.  Allergic/Immunologic: Negative for environmental allergies.  Neurological:  Negative for dizziness, weakness and headaches.  Psychiatric/Behavioral:  Positive for agitation and dysphoric mood. The patient is nervous/anxious.        States that he has had a few episodes associated with anger. Two episodes since his last visit here.        Objective     Today's Vitals   02/11/22 0835  BP: 119/73  Pulse: 68  Temp: (!) 97.2 F (36.2 C)  SpO2: 99%  Weight: 209 lb 9.6 oz (95.1 kg)  Height: 6' 3.2" (1.91 m)   Body mass index is 26.06 kg/m.   BP Readings from Last 3 Encounters:  02/11/22 119/73  11/10/21 121/72  08/17/21 (!) 147/117    Wt Readings from Last  3 Encounters:  02/11/22 209 lb 9.6 oz (95.1 kg)  11/10/21 220 lb 11.2 oz (100.1 kg)  08/17/21 215 lb (97.5 kg)    Physical Exam Vitals and nursing note reviewed.  Constitutional:      Appearance: Normal appearance. He is well-developed.  HENT:     Head: Normocephalic and atraumatic.     Nose: Nose normal.     Mouth/Throat:     Mouth: Mucous membranes are moist.     Pharynx: Oropharynx is clear.  Eyes:     Extraocular Movements: Extraocular movements intact.     Conjunctiva/sclera:  Conjunctivae normal.     Pupils: Pupils are equal, round, and reactive to light.  Neck:     Vascular: No carotid bruit.  Cardiovascular:     Rate and Rhythm: Normal rate and regular rhythm.     Pulses: Normal pulses.     Heart sounds: Normal heart sounds.  Pulmonary:     Effort: Pulmonary effort is normal.     Breath sounds: Normal breath sounds.  Abdominal:     General: Bowel sounds are normal. There is no distension.     Palpations: Abdomen is soft. There is mass.     Tenderness: There is no abdominal tenderness. There is no right CVA tenderness, left CVA tenderness, guarding or rebound.     Hernia: No hernia is present.    Musculoskeletal:        General: Normal range of motion.     Cervical back: Normal range of motion and neck supple.  Lymphadenopathy:     Cervical: No cervical adenopathy.  Skin:    General: Skin is warm and dry.     Capillary Refill: Capillary refill takes less than 2 seconds.  Neurological:     General: No focal deficit present.     Mental Status: He is alert and oriented to person, place, and time.  Psychiatric:        Mood and Affect: Mood normal.        Behavior: Behavior normal.        Thought Content: Thought content normal.        Judgment: Judgment normal.       Assessment & Plan    1. Encounter for general adult medical examination with abnormal findings Annual physical today   2. Primary hypertension Stable. Continue BP medications as prescribed. Routine, fasting labs drawn during today's visit.  - Lipid panel; Future - Comprehensive metabolic panel; Future - CBC; Future - Hemoglobin A1c; Future - Ambulatory referral to Cardiology - chlorthalidone (HYGROTON) 50 MG tablet; Take 1 tablet (50 mg total) by mouth daily.  Dispense: 30 tablet; Refill: 1 - Hemoglobin A1c - CBC - Comprehensive metabolic panel - Lipid panel  3. Congestive heart failure, unspecified HF chronicity, unspecified heart failure type [facility]) Referral to new  cardiologist for management. Recent ECG normal. Routine, fasting labs drawn during today's visit.  - Lipid panel; Future - Comprehensive metabolic panel; Future - CBC; Future - Ambulatory referral to Cardiology - CBC - Comprehensive metabolic panel - Lipid panel  4. Mixed hyperlipidemia Check fasting lipid panel. Adjust dose atorvastatin as indicated.  - Lipid panel; Future - Comprehensive metabolic panel; Future - CBC; Future - Hemoglobin A1c; Future - Ambulatory referral to Cardiology - atorvastatin (LIPITOR) 20 MG tablet; Take 1 tablet (20 mg total) by mouth once daily.  Dispense: 90 tablet; Refill: 1 - Hemoglobin A1c - CBC - Comprehensive metabolic panel - Lipid panel  5. Thyroid mass Check  thyroid panel. Adjust treatment as indicated  - TSH + free T4; Future - TSH + free T4  6. Gastroesophageal reflux disease without esophagitis May take omeprazole 20 mg daily.  - omeprazole (PRILOSEC) 20 MG capsule; Take 1 capsule (20 mg total) by mouth daily.  Dispense: 90 capsule; Refill: 1  7. Erectile dysfunction, unspecified erectile dysfunction type Take cialis, up to 20 mg, when needed.  - tadalafil (CIALIS) 10 MG tablet; Take 2 tablets (20mg  total) by mouth 1 hour prior to sexual activity.  Dispense: 10 tablet; Refill: 3  8. Chronic nonintractable headache, unspecified headache type May take imitrex as needed and as prescribed  - SUMAtriptan (IMITREX) 50 MG tablet; May repeat in 2 hours if headache persists or recurs.  Dispense: 10 tablet; Refill: 2  9. Need for Tdap vaccination Tdap vaccine administered during today's visit.   - Tdap vaccine greater than or equal to 7yo IM  10. Screening for prostate cancer PSA drawn during today's visit.  - PSA; Future - PSA  11. Screening for colon cancer Cologuard ordered to screen for colon cancer  - Cologuard  12. Healthcare maintenance Routine, fasting labs drawn during today's visit.  - TSH + free T4; Future - Lipid panel;  Future - Comprehensive metabolic panel; Future - CBC; Future - Hemoglobin A1c; Future - Hemoglobin A1c - CBC - Comprehensive metabolic panel - Lipid panel - TSH + free T4   Problem List Items Addressed This Visit       Cardiovascular and Mediastinum   Hypertension   Relevant Medications   tadalafil (CIALIS) 10 MG tablet   atorvastatin (LIPITOR) 20 MG tablet   losartan (COZAAR) 25 MG tablet   chlorthalidone (HYGROTON) 50 MG tablet   Other Relevant Orders   Lipid panel (Completed)   Comprehensive metabolic panel (Completed)   CBC (Completed)   Hemoglobin A1c (Completed)   Ambulatory referral to Cardiology   Congestive heart failure (HCC)   Relevant Medications   tadalafil (CIALIS) 10 MG tablet   atorvastatin (LIPITOR) 20 MG tablet   losartan (COZAAR) 25 MG tablet   chlorthalidone (HYGROTON) 50 MG tablet   Other Relevant Orders   Lipid panel (Completed)   Comprehensive metabolic panel (Completed)   CBC (Completed)   Ambulatory referral to Cardiology     Digestive   Acid reflux   Relevant Medications   omeprazole (PRILOSEC) 20 MG capsule     Other   Chronic headaches   Relevant Medications   SUMAtriptan (IMITREX) 50 MG tablet   Hyperlipidemia   Relevant Medications   tadalafil (CIALIS) 10 MG tablet   atorvastatin (LIPITOR) 20 MG tablet   losartan (COZAAR) 25 MG tablet   chlorthalidone (HYGROTON) 50 MG tablet   Other Relevant Orders   Lipid panel (Completed)   Comprehensive metabolic panel (Completed)   CBC (Completed)   Hemoglobin A1c (Completed)   Ambulatory referral to Cardiology   Thyroid mass   Relevant Orders   TSH + free T4 (Completed)   Erectile dysfunction   Relevant Medications   tadalafil (CIALIS) 10 MG tablet   Other Visit Diagnoses     Encounter for general adult medical examination with abnormal findings    -  Primary   Need for Tdap vaccination       Relevant Orders   Tdap vaccine greater than or equal to 7yo IM (Completed)   Screening  for prostate cancer       Relevant Orders   PSA (Completed)   Screening for colon  cancer       Relevant Orders   Cologuard   Healthcare maintenance       Relevant Orders   TSH + free T4 (Completed)   Lipid panel (Completed)   Comprehensive metabolic panel (Completed)   CBC (Completed)   Hemoglobin A1c (Completed)        Return in about 6 months (around 08/13/2022) for blood pressure.         Ronnell Freshwater, NP  [facility] [phone] (phone) [phone] (fax)  [place]

## 2022-02-11 ENCOUNTER — Ambulatory Visit (INDEPENDENT_AMBULATORY_CARE_PROVIDER_SITE_OTHER): Payer: BC Managed Care – PPO | Admitting: Nurse Practitioner

## 2022-02-11 ENCOUNTER — Other Ambulatory Visit: Payer: Self-pay

## 2022-02-11 ENCOUNTER — Encounter: Payer: Self-pay | Admitting: Nurse Practitioner

## 2022-02-11 VITALS — BP 119/73 | HR 68 | Temp 97.2°F | Ht 75.2 in | Wt 209.6 lb

## 2022-02-11 DIAGNOSIS — Z125 Encounter for screening for malignant neoplasm of prostate: Secondary | ICD-10-CM

## 2022-02-11 DIAGNOSIS — I10 Essential (primary) hypertension: Secondary | ICD-10-CM

## 2022-02-11 DIAGNOSIS — N529 Male erectile dysfunction, unspecified: Secondary | ICD-10-CM

## 2022-02-11 DIAGNOSIS — K219 Gastro-esophageal reflux disease without esophagitis: Secondary | ICD-10-CM

## 2022-02-11 DIAGNOSIS — Z0000 Encounter for general adult medical examination without abnormal findings: Secondary | ICD-10-CM

## 2022-02-11 DIAGNOSIS — Z0001 Encounter for general adult medical examination with abnormal findings: Secondary | ICD-10-CM | POA: Diagnosis not present

## 2022-02-11 DIAGNOSIS — Z23 Encounter for immunization: Secondary | ICD-10-CM

## 2022-02-11 DIAGNOSIS — G8929 Other chronic pain: Secondary | ICD-10-CM

## 2022-02-11 DIAGNOSIS — E782 Mixed hyperlipidemia: Secondary | ICD-10-CM

## 2022-02-11 DIAGNOSIS — E079 Disorder of thyroid, unspecified: Secondary | ICD-10-CM

## 2022-02-11 DIAGNOSIS — Z1211 Encounter for screening for malignant neoplasm of colon: Secondary | ICD-10-CM

## 2022-02-11 DIAGNOSIS — I509 Heart failure, unspecified: Secondary | ICD-10-CM | POA: Diagnosis not present

## 2022-02-11 DIAGNOSIS — I5022 Chronic systolic (congestive) heart failure: Secondary | ICD-10-CM | POA: Diagnosis not present

## 2022-02-11 DIAGNOSIS — R519 Headache, unspecified: Secondary | ICD-10-CM

## 2022-02-11 MED ORDER — ATORVASTATIN CALCIUM 20 MG PO TABS: 20.0000 mg | ORAL_TABLET | Freq: Every day | ORAL | 1 refills | Status: DC

## 2022-02-11 MED ORDER — LOSARTAN POTASSIUM 25 MG PO TABS: 25.0000 mg | ORAL_TABLET | Freq: Every day | ORAL | 1 refills | Status: DC

## 2022-02-11 MED ORDER — ATORVASTATIN CALCIUM 20 MG PO TABS
20.0000 mg | ORAL_TABLET | Freq: Every day | ORAL | 1 refills | Status: DC
  Filled 2022-02-11: qty 90, 90d supply, fill #0

## 2022-02-11 MED ORDER — SUMATRIPTAN SUCCINATE 50 MG PO TABS: ORAL_TABLET | ORAL | 2 refills | Status: AC

## 2022-02-11 MED ORDER — OMEPRAZOLE 20 MG PO CPDR
20.0000 mg | DELAYED_RELEASE_CAPSULE | Freq: Every day | ORAL | 1 refills | Status: DC
  Filled 2022-02-11: qty 90, 90d supply, fill #0

## 2022-02-11 MED ORDER — CHLORTHALIDONE 50 MG PO TABS: 50.0000 mg | ORAL_TABLET | Freq: Every day | ORAL | 1 refills | Status: DC

## 2022-02-11 MED ORDER — LOSARTAN POTASSIUM 25 MG PO TABS
25.0000 mg | ORAL_TABLET | Freq: Every day | ORAL | 1 refills | Status: DC
  Filled 2022-02-11: qty 90, 90d supply, fill #0

## 2022-02-11 MED ORDER — OMEPRAZOLE 20 MG PO CPDR: 20.0000 mg | DELAYED_RELEASE_CAPSULE | Freq: Every day | ORAL | 1 refills | Status: DC

## 2022-02-11 MED ORDER — TADALAFIL 10 MG PO TABS: ORAL_TABLET | ORAL | 3 refills | Status: DC

## 2022-02-11 MED ORDER — CHLORTHALIDONE 50 MG PO TABS
50.0000 mg | ORAL_TABLET | Freq: Every day | ORAL | 1 refills | Status: DC
  Filled 2022-02-11: qty 30, 30d supply, fill #0

## 2022-02-11 MED ORDER — TADALAFIL 10 MG PO TABS
ORAL_TABLET | ORAL | 3 refills | Status: DC
  Filled 2022-02-11: qty 10, fill #0

## 2022-02-12 LAB — COMPREHENSIVE METABOLIC PANEL
ALT: 28 IU/L (ref 0–44)
AST: 22 IU/L (ref 0–40)
Albumin/Globulin Ratio: 1.7 (ref 1.2–2.2)
Albumin: 4.5 g/dL (ref 3.8–4.9)
Alkaline Phosphatase: 69 IU/L (ref 44–121)
BUN/Creatinine Ratio: 16 (ref 9–20)
BUN: 20 mg/dL (ref 6–24)
Bilirubin Total: 0.4 mg/dL (ref 0.0–1.2)
CO2: 26 mmol/L (ref 20–29)
Calcium: 9.5 mg/dL (ref 8.7–10.2)
Chloride: 102 mmol/L (ref 96–106)
Creatinine, Ser: 1.29 mg/dL — ABNORMAL HIGH (ref 0.76–1.27)
Globulin, Total: 2.6 g/dL (ref 1.5–4.5)
Glucose: 121 mg/dL — ABNORMAL HIGH (ref 70–99)
Potassium: 3.2 mmol/L — ABNORMAL LOW (ref 3.5–5.2)
Sodium: 143 mmol/L (ref 134–144)
Total Protein: 7.1 g/dL (ref 6.0–8.5)
eGFR: 67 mL/min/{1.73_m2} (ref >59)

## 2022-02-12 LAB — LIPID PANEL
Chol/HDL Ratio: 4.3 ratio (ref 0.0–5.0)
Cholesterol, Total: 138 mg/dL (ref 100–199)
HDL: 32 mg/dL — ABNORMAL LOW (ref >39)
LDL Chol Calc (NIH): 72 mg/dL (ref 0–99)
Triglycerides: 205 mg/dL — ABNORMAL HIGH (ref 0–149)
VLDL Cholesterol Cal: 34 mg/dL (ref 5–40)

## 2022-02-12 LAB — PSA: Prostate Specific Ag, Serum: 1.3 ng/mL (ref 0.0–4.0)

## 2022-02-12 LAB — HEMOGLOBIN A1C
Est. average glucose Bld gHb Est-mCnc: 117 mg/dL
Hgb A1c MFr Bld: 5.7 % — ABNORMAL HIGH (ref 4.8–5.6)

## 2022-02-12 LAB — CBC
Hematocrit: 43.9 % (ref 37.5–51.0)
Hemoglobin: 14.5 g/dL (ref 13.0–17.7)
MCH: 28.7 pg (ref 26.6–33.0)
MCHC: 33 g/dL (ref 31.5–35.7)
MCV: 87 fL (ref 79–97)
Platelets: 271 10*3/uL (ref 150–450)
RBC: 5.05 x10E6/uL (ref 4.14–5.80)
RDW: 13.5 % (ref 11.6–15.4)
WBC: 6.4 10*3/uL (ref 3.4–10.8)

## 2022-02-12 LAB — TSH+FREE T4
Free T4: 1.36 ng/dL (ref 0.82–1.77)
TSH: 1 u[IU]/mL (ref 0.450–4.500)

## 2022-03-02 DIAGNOSIS — F4321 Adjustment disorder with depressed mood: Secondary | ICD-10-CM | POA: Diagnosis not present

## 2022-03-16 DIAGNOSIS — F4321 Adjustment disorder with depressed mood: Secondary | ICD-10-CM | POA: Diagnosis not present

## 2022-03-30 DIAGNOSIS — F4321 Adjustment disorder with depressed mood: Secondary | ICD-10-CM | POA: Diagnosis not present

## 2022-04-07 ENCOUNTER — Other Ambulatory Visit: Payer: Self-pay | Admitting: Nurse Practitioner

## 2022-04-07 DIAGNOSIS — I10 Essential (primary) hypertension: Secondary | ICD-10-CM

## 2022-04-13 DIAGNOSIS — F4321 Adjustment disorder with depressed mood: Secondary | ICD-10-CM | POA: Diagnosis not present

## 2022-04-27 DIAGNOSIS — F4321 Adjustment disorder with depressed mood: Secondary | ICD-10-CM | POA: Diagnosis not present

## 2022-05-09 ENCOUNTER — Other Ambulatory Visit: Payer: Self-pay | Admitting: Nurse Practitioner

## 2022-05-09 DIAGNOSIS — I10 Essential (primary) hypertension: Secondary | ICD-10-CM

## 2022-05-09 DIAGNOSIS — K219 Gastro-esophageal reflux disease without esophagitis: Secondary | ICD-10-CM

## 2022-05-10 ENCOUNTER — Other Ambulatory Visit: Payer: Self-pay

## 2022-05-10 DIAGNOSIS — E782 Mixed hyperlipidemia: Secondary | ICD-10-CM

## 2022-05-10 MED ORDER — ATORVASTATIN CALCIUM 20 MG PO TABS: 20.0000 mg | ORAL_TABLET | Freq: Every day | ORAL | 1 refills | Status: DC

## 2022-05-11 DIAGNOSIS — F4321 Adjustment disorder with depressed mood: Secondary | ICD-10-CM | POA: Diagnosis not present

## 2022-05-25 DIAGNOSIS — F4321 Adjustment disorder with depressed mood: Secondary | ICD-10-CM | POA: Diagnosis not present

## 2022-05-31 NOTE — Progress Notes (Signed)
Chronically low potassium, mildly elevated lipids and sugar.  Will monitor. Recheck at next visit

## 2022-06-07 ENCOUNTER — Ambulatory Visit
Admission: EM | Admit: 2022-06-07 | Discharge: 2022-06-07 | Disposition: A | Discharge status: Home / Self Care | Payer: BC Managed Care – PPO | Attending: Internal Medicine | Admitting: Internal Medicine

## 2022-06-07 DIAGNOSIS — M549 Dorsalgia, unspecified: Secondary | ICD-10-CM

## 2022-06-07 MED ORDER — METHOCARBAMOL 500 MG PO TABS: 500.0000 mg | ORAL_TABLET | Freq: Every evening | ORAL | 0 refills | Status: DC | PRN

## 2022-06-07 MED ORDER — IBUPROFEN 800 MG PO TABS: 800.0000 mg | ORAL_TABLET | Freq: Three times a day (TID) | ORAL | 0 refills | Status: DC

## 2022-06-07 NOTE — ED Triage Notes (Signed)
Pt presents with back pain after a fall last night; pt states his pain increased this morning when he tried to bend over to get dressed.

## 2022-06-07 NOTE — Discharge Instructions (Addendum)
Heating pad use only 20 minutes on-20 minutes off cycle As the pain improves please do gentle stretching exercises to help with muscle stiffness Take medications as prescribed Return to urgent care if you have any worsening symptoms No indication for x-rays at this time.

## 2022-06-07 NOTE — ED Provider Notes (Signed)
[facility]    CSN: [id number] Arrival date & time: 06/07/22  0911      History   Chief Complaint Chief Complaint  Patient presents with   Back Pain    HPI Matthew Marquez is a 51 y.o. male comes to the urgent care with lower back pain which started last night.  Patient lost his balance and fell last night.  Pain is currently of moderate severity and is aggravated by bending over.  Pain does not radiate to the lower extremities.  He has no bladder or bowel difficulties.  No nausea or vomiting.  No numbness or tingling in the lower extremities.  No known relieving factors.  No weakness in both lower extremities.  No bruising on the lower back.Marland Kitchen   HPI  Past Medical History:  Diagnosis Date   Acid reflux    Asthma    Bruising    Cervicalgia    Chronic headaches    Congestive heart failure (HCC)    pt unsure if correct dx   Hypertension    Hypoglycemia     Patient Active Problem List   Diagnosis Date Noted   Body mass index 27.0-27.9, adult 11/10/2021   Tightness in chest 11/10/2021   Encounter to establish care 01/07/2021   Erectile dysfunction 01/07/2021   Chronic ankle pain 01/07/2021   Loose, teeth 01/07/2021   History of asthma 01/07/2021   Hyperlipidemia 02/14/2018   COPD (chronic obstructive pulmonary disease) (HCC) 02/14/2018   Thyroid mass 02/14/2018   Hypoglycemia    Hypertension    Congestive heart failure (HCC)    Chronic headaches    Cervicalgia    Bruising    Acid reflux     Past Surgical History:  Procedure Laterality Date   MOUTH SURGERY         Home Medications    Prior to Admission medications   Medication Sig Start Date End Date Taking? Authorizing Provider  ibuprofen (ADVIL) 800 MG tablet Take 1 tablet (800 mg total) by mouth 3 (three) times daily. 06/07/22  Yes Foy Vanduyne, Britta Mccreedy, MD  methocarbamol (ROBAXIN) 500 MG tablet Take 1 tablet (500 mg total) by mouth at bedtime as needed for muscle spasms. 06/07/22  Yes  Ethon Wymer, Britta Mccreedy, MD  atorvastatin (LIPITOR) 20 MG tablet Take 1 tablet (20 mg total) by mouth once daily. 05/10/22   Carlean Jews, NP  carvedilol (COREG) 12.5 MG tablet Take 1 tablet (12.5 mg total) by mouth 2 (two) times daily. 11/10/21 11/10/22  Carlean Jews, NP  chlorthalidone (HYGROTON) 50 MG tablet TAKE 1 TABLET(50 MG) BY MOUTH DAILY 04/07/22   Carlean Jews, NP  diclofenac Sodium (VOLTAREN) 1 % GEL Apply 2g topically to the affected area(s) 4 (four) times daily. 04/09/21   Cannady, Corrie Dandy T, NP  losartan (COZAAR) 25 MG tablet TAKE 1 TABLET(25 MG) BY MOUTH EVERY DAY 05/10/22   Carlean Jews, NP  omeprazole (PRILOSEC) 20 MG capsule TAKE 1 CAPSULE(20 MG) BY MOUTH DAILY 05/10/22   Carlean Jews, NP  SUMAtriptan (IMITREX) 50 MG tablet May repeat in 2 hours if headache persists or recurs. 02/11/22   Carlean Jews, NP  tadalafil (CIALIS) 10 MG tablet Take 2 tablets (20mg  total) by mouth 1 hour prior to sexual activity. 02/11/22   02/13/22, NP    Family History Family History  Problem Relation Age of Onset   Cancer Mother    Hyperlipidemia Mother    Hypertension Mother  Diabetes Maternal Grandmother    Hypertension Maternal Grandmother    Suicidality Father    Edema Sister        LE   Cancer Brother     Social History Social History   Tobacco Use   Smoking status: Former    Types: Cigarettes    Quit date: 2015    Years since quitting: 8.8   Smokeless tobacco: Never  Vaping Use   Vaping Use: Never used  Substance Use Topics   Alcohol use: Yes    Alcohol/week: 1.0 standard drink of alcohol    Types: 1 Cans of beer per week    Comment: social, last use 2010   Drug use: Never     Allergies   Patient has no known allergies.   Review of Systems Review of Systems As per HPI  Physical Exam Triage Vital Signs ED Triage Vitals [06/07/22 1037]  Enc Vitals Group     BP 132/85     Pulse Rate 72     Resp 18     Temp 98.1 F (36.7 C)     Temp  Source Oral     SpO2 98 %     Weight      Height      Head Circumference      Peak Flow      Pain Score 9     Pain Loc      Pain Edu?      Excl. in [place]?    No data found.  Updated Vital Signs BP 132/85 (BP Location: Left Arm)   Pulse 72   Temp 98.1 F (36.7 C) (Oral)   Resp 18   SpO2 98%   Visual Acuity Right Eye Distance:   Left Eye Distance:   Bilateral Distance:    Right Eye Near:   Left Eye Near:    Bilateral Near:     Physical Exam Vitals and nursing note reviewed.  Constitutional:      General: He is not in acute distress.    Appearance: He is not ill-appearing.  Cardiovascular:     Rate and Rhythm: Normal rate and regular rhythm.     Pulses: Normal pulses.     Heart sounds: Normal heart sounds.  Pulmonary:     Effort: Pulmonary effort is normal.     Breath sounds: Normal breath sounds.  Musculoskeletal:     Comments: Tenderness on palpation over the lower back.  Full range of motion of the lumbar spine.  Full flexion and extension of the lower back.  Power is 5/5 in both lower extremities.  Deep tendon reflexes 2+ in both patella ligaments.  Neurological:     Mental Status: He is alert.      UC Treatments / Results  Labs (all labs ordered are listed, but only abnormal results are displayed) Labs Reviewed - No data to display  EKG   Radiology No results found.  Procedures Procedures (including critical care time)  Medications Ordered in UC Medications - No data to display  Initial Impression / Assessment and Plan / UC Course  I have reviewed the triage vital signs and the nursing notes.  Pertinent labs & imaging results that were available during my care of the patient were reviewed by me and considered in my medical decision making (see chart for details).     1.  Musculoskeletal back pain: Heating pad use some 20 minutes on-20 minutes off cycle Gentle range of motion exercises. Ibuprofen as  needed for pain and/or fever Robaxin as  needed for muscle spasms If symptoms worsen please return to urgent care to be reevaluated No imaging studies indicated at this time. Final Clinical Impressions(s) / UC Diagnoses   Final diagnoses:  Musculoskeletal back pain     Discharge Instructions      Heating pad use only 20 minutes on-20 minutes off cycle As the pain improves please do gentle stretching exercises to help with muscle stiffness Take medications as prescribed Return to urgent care if you have any worsening symptoms No indication for x-rays at this time.   ED Prescriptions     Medication Sig Dispense Auth. Provider   ibuprofen (ADVIL) 800 MG tablet Take 1 tablet (800 mg total) by mouth 3 (three) times daily. 21 tablet Ermin Parisien, Britta Mccreedy, MD   methocarbamol (ROBAXIN) 500 MG tablet Take 1 tablet (500 mg total) by mouth at bedtime as needed for muscle spasms. 10 tablet Breyon Sigg, Britta Mccreedy, MD      PDMP not reviewed this encounter.   Merrilee Jansky, MD 06/07/22 [phone]

## 2022-06-08 DIAGNOSIS — F4321 Adjustment disorder with depressed mood: Secondary | ICD-10-CM | POA: Diagnosis not present

## 2022-06-12 IMAGING — CR DG ANKLE COMPLETE 3+V*R*
3 series · 3 of 3 positions shown · non-contrast
Comparison: None.

CLINICAL DATA: 50-year-old male with right ankle sprain.

EXAM:
RIGHT ANKLE - COMPLETE 3+ VIEW

[ankle ap]
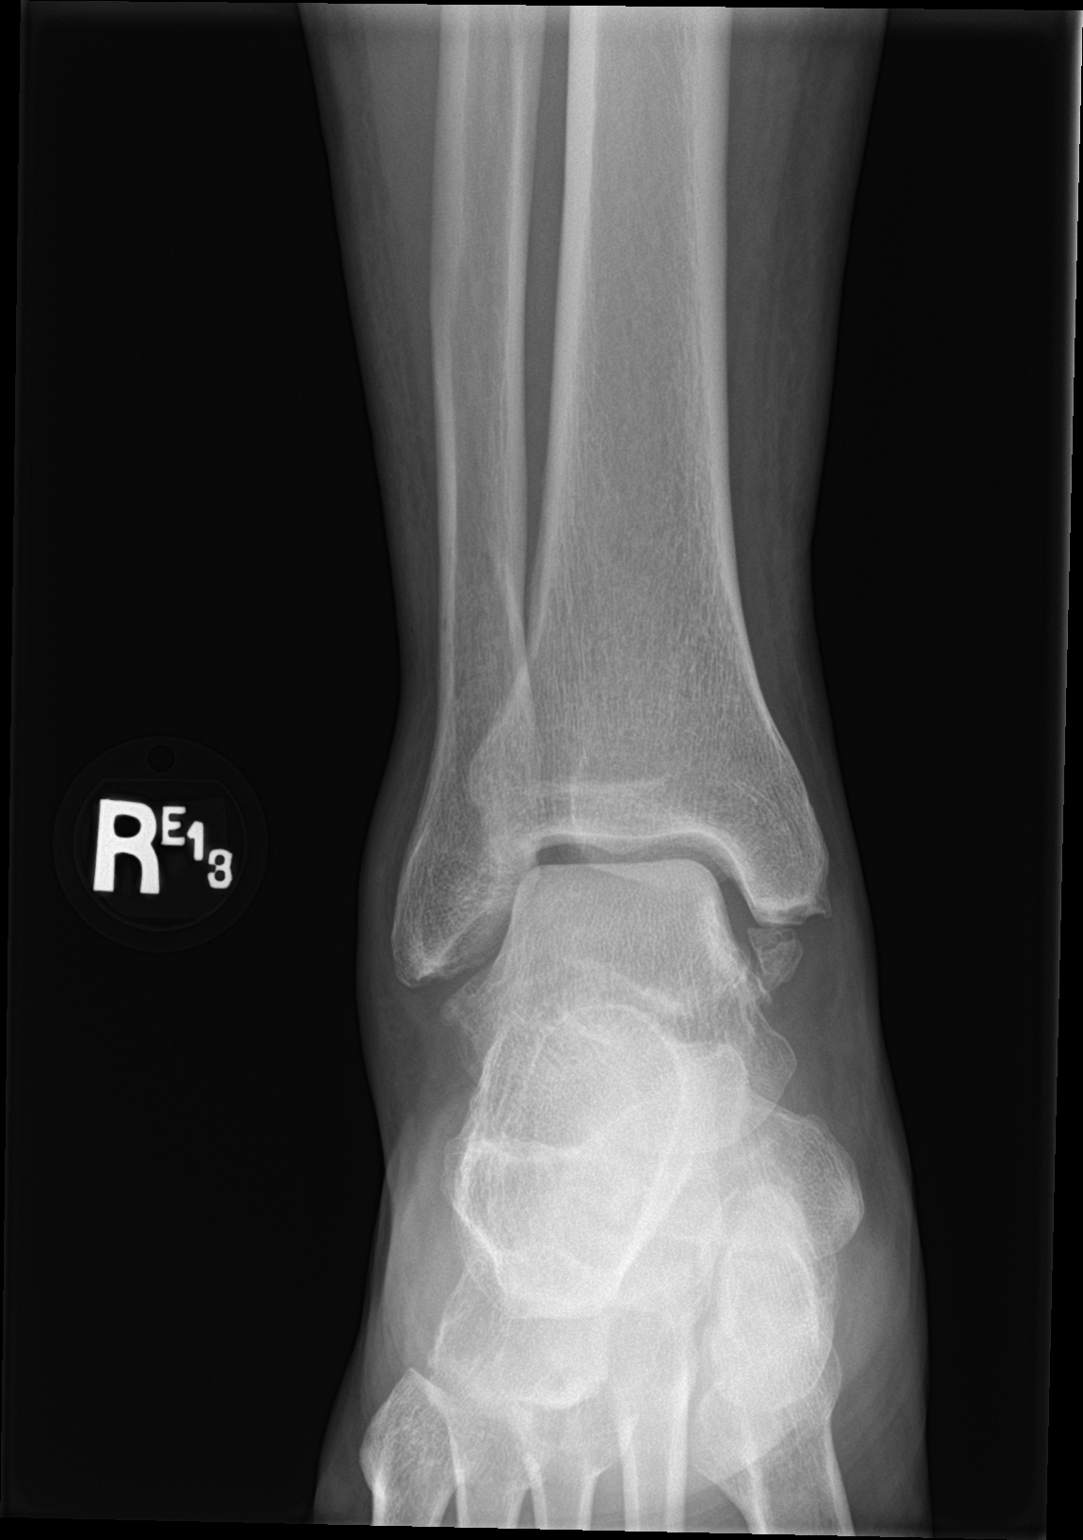

[ankle obl]
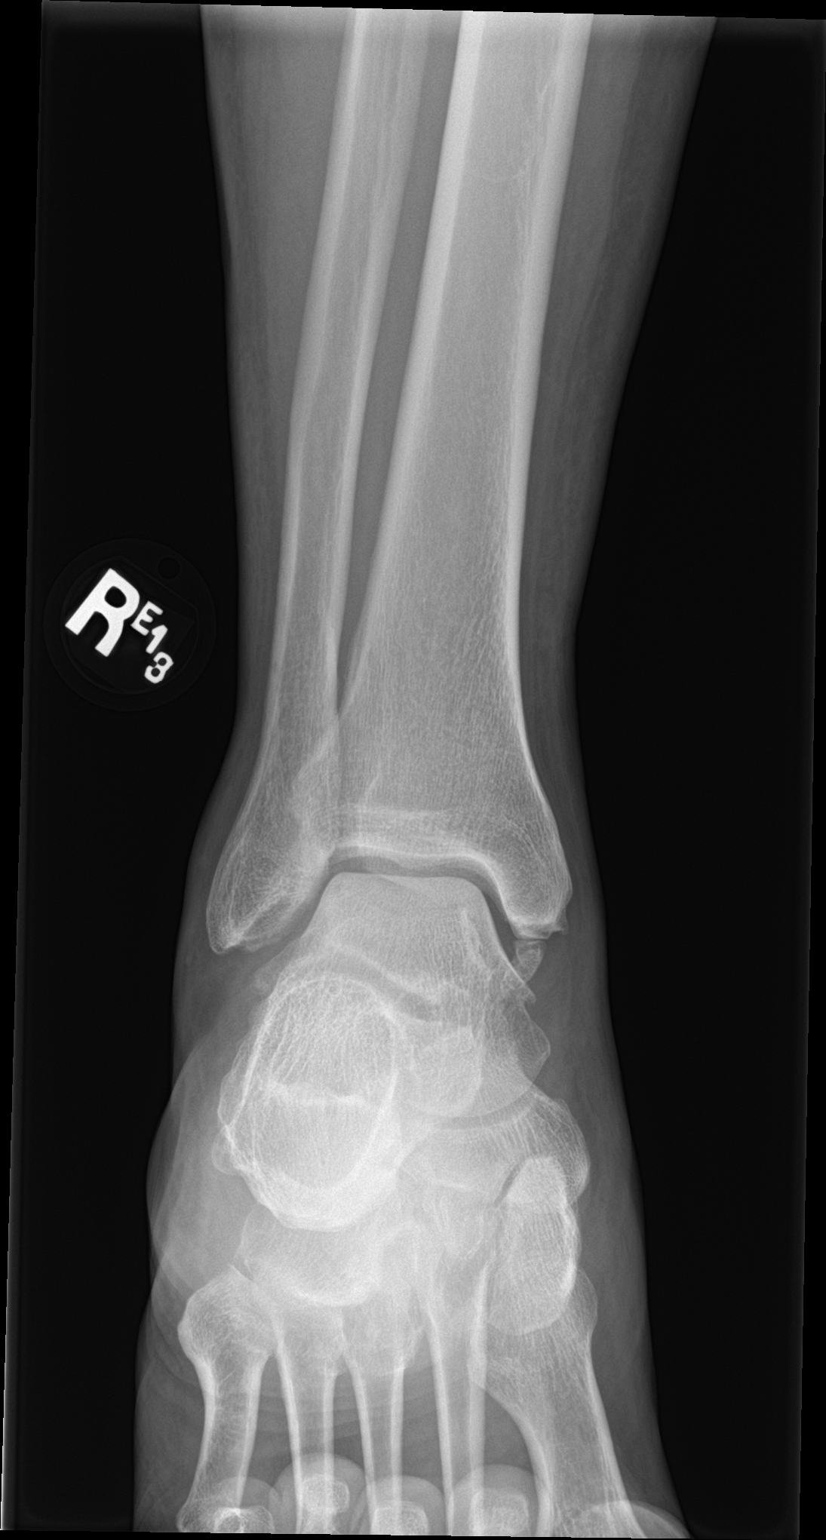

[ankle lat]
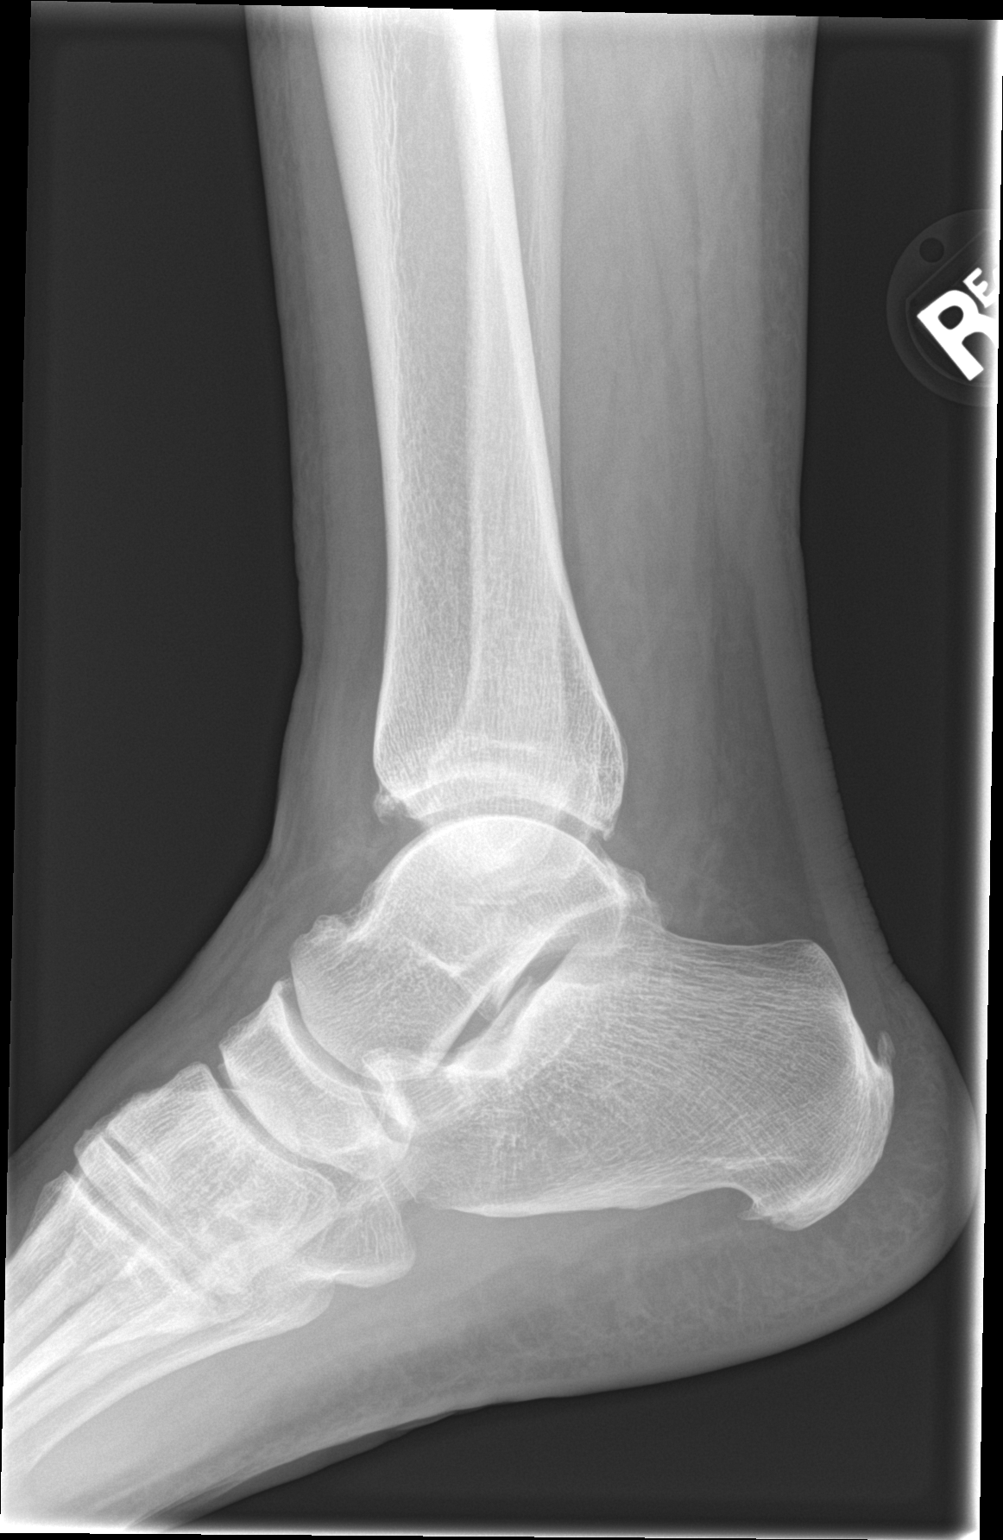

[3 of 3 positions shown; findings below may reference images not displayed]

FINDINGS: Corticated appearing bone fragment adjacent to the medial malleolus,
likely chronic and related to prior avulsion fracture. Correlation
with point tenderness recommended. No acute fracture identified.
There is no dislocation. The ankle mortise is intact. The soft
tissues are unremarkable.
IMPRESSION: 1. No acute fracture or dislocation.
2. Probable old avulsion fracture of the medial malleolus.

## 2022-06-22 DIAGNOSIS — F4321 Adjustment disorder with depressed mood: Secondary | ICD-10-CM | POA: Diagnosis not present

## 2022-07-11 ENCOUNTER — Other Ambulatory Visit: Payer: Self-pay | Admitting: Nurse Practitioner

## 2022-07-11 DIAGNOSIS — I10 Essential (primary) hypertension: Secondary | ICD-10-CM

## 2022-07-12 DIAGNOSIS — M25571 Pain in right ankle and joints of right foot: Secondary | ICD-10-CM | POA: Diagnosis not present

## 2022-07-12 DIAGNOSIS — S93401A Sprain of unspecified ligament of right ankle, initial encounter: Secondary | ICD-10-CM | POA: Diagnosis not present

## 2022-07-12 DIAGNOSIS — M5416 Radiculopathy, lumbar region: Secondary | ICD-10-CM | POA: Diagnosis not present

## 2022-07-19 DIAGNOSIS — F4321 Adjustment disorder with depressed mood: Secondary | ICD-10-CM | POA: Diagnosis not present

## 2022-07-20 DIAGNOSIS — M545 Low back pain, unspecified: Secondary | ICD-10-CM | POA: Diagnosis not present

## 2022-07-20 DIAGNOSIS — M5416 Radiculopathy, lumbar region: Secondary | ICD-10-CM | POA: Diagnosis not present

## 2022-08-02 DIAGNOSIS — F4321 Adjustment disorder with depressed mood: Secondary | ICD-10-CM | POA: Diagnosis not present

## 2022-08-13 ENCOUNTER — Ambulatory Visit: Payer: BC Managed Care – PPO | Admitting: Nurse Practitioner

## 2022-08-17 NOTE — Progress Notes (Signed)
Established patient visit   Patient: Matthew Marquez   DOB: 11-16-1970   52 y.o. Male  MRN: [id number] Visit Date: 08/18/2022   Chief Complaint  Patient presents with   Follow-up   Hypertension   Fall   Back Pain   Subjective    HPI  Follow up -hypertension  -CAD -CHF --referred to cardiology for management  -recent labs show mildmoderate elevation of triglycerides  --normal HDL/CHOL ratio of 4.3  --HgbA1c 5.7 with glucose of 121   Fall back in October.  -went to ER then saw orthopedics  -sprained right ankle  -had right hip pain, no abnormalities of hip. Pain believed to be related to lumbar disc disease or disc herniation.  -now taking diclofenac twice daily. Has to take tylenol with it four times daily  -unable to afford MRI for further evaluation.  -pain is most sever in right buttock and radiates down the right leg.    Medications: Outpatient Medications Prior to Visit  Medication Sig   atorvastatin (LIPITOR) 20 MG tablet Take 1 tablet (20 mg total) by mouth once daily.   carvedilol (COREG) 12.5 MG tablet Take 1 tablet (12.5 mg total) by mouth 2 (two) times daily.   chlorthalidone (HYGROTON) 50 MG tablet TAKE 1 TABLET(50 MG) BY MOUTH DAILY   ibuprofen (ADVIL) 800 MG tablet Take 1 tablet (800 mg total) by mouth 3 (three) times daily.   losartan (COZAAR) 25 MG tablet TAKE 1 TABLET(25 MG) BY MOUTH EVERY DAY   omeprazole (PRILOSEC) 20 MG capsule TAKE 1 CAPSULE(20 MG) BY MOUTH DAILY   SUMAtriptan (IMITREX) 50 MG tablet May repeat in 2 hours if headache persists or recurs.   [DISCONTINUED] diclofenac (VOLTAREN) 75 MG EC tablet Take 75 mg by mouth 2 (two) times daily.   [DISCONTINUED] diclofenac Sodium (VOLTAREN) 1 % GEL Apply 2g topically to the affected area(s) 4 (four) times daily.   [DISCONTINUED] methocarbamol (ROBAXIN) 500 MG tablet Take 1 tablet (500 mg total) by mouth at bedtime as needed for muscle spasms.   [DISCONTINUED] tadalafil (CIALIS) 10 MG tablet Take 2  tablets (20mg  total) by mouth 1 hour prior to sexual activity.   No facility-administered medications prior to visit.    Review of Systems  Constitutional:  Negative for activity change, chills, fatigue and fever.  HENT:  Negative for congestion, postnasal drip, rhinorrhea, sinus pressure, sinus pain, sneezing and sore throat.   Eyes: Negative.   Respiratory:  Negative for cough, shortness of breath and wheezing.   Cardiovascular:  Negative for chest pain and palpitations.  Gastrointestinal:  Negative for constipation, diarrhea, nausea and vomiting.  Endocrine: Negative for cold intolerance, heat intolerance, polydipsia and polyuria.  Genitourinary:  Negative for dysuria, frequency and urgency.  Musculoskeletal:  Positive for arthralgias, back pain and myalgias.  Skin:  Negative for rash.  Allergic/Immunologic: Negative for environmental allergies.  Neurological:  Negative for dizziness, weakness and headaches.  Psychiatric/Behavioral:  The patient is not nervous/anxious.     Last CBC Lab Results  Component Value Date   WBC 6.4 02/11/2022   HGB 14.5 02/11/2022   HCT 43.9 02/11/2022   MCV 87 02/11/2022   MCH 28.7 02/11/2022   RDW 13.5 02/11/2022   PLT 271 40/98/1191   Last metabolic panel Lab Results  Component Value Date   GLUCOSE 121 (H) 02/11/2022   NA 143 02/11/2022   K 3.2 (L) 02/11/2022   CL 102 02/11/2022   CO2 26 02/11/2022   BUN 20 02/11/2022   CREATININE  1.29 (H) 02/11/2022   EGFR 67 02/11/2022   CALCIUM 9.5 02/11/2022   PROT 7.1 02/11/2022   ALBUMIN 4.5 02/11/2022   LABGLOB 2.6 02/11/2022   AGRATIO 1.7 02/11/2022   BILITOT 0.4 02/11/2022   ALKPHOS 69 02/11/2022   AST 22 02/11/2022   ALT 28 02/11/2022   ANIONGAP 11 08/17/2021   Last lipids Lab Results  Component Value Date   CHOL 138 02/11/2022   HDL 32 (L) 02/11/2022   LDLCALC 72 02/11/2022   TRIG 205 (H) 02/11/2022   CHOLHDL 4.3 02/11/2022   Last hemoglobin A1c Lab Results  Component Value  Date   HGBA1C 5.7 (H) 02/11/2022   Last thyroid functions Lab Results  Component Value Date   TSH 1.000 02/11/2022   Last vitamin D Lab Results  Component Value Date   VD25OH 20.8 (L) 12/20/2018       Objective     Today's Vitals   08/18/22 1002  BP: 136/88  Pulse: 80  Resp: 18  SpO2: 97%  Weight: 226 lb (102.5 kg)  Height: 6' 3.2" (1.91 m)  PainSc: 4   PainLoc: Back   Body mass index is 28.1 kg/m.  BP Readings from Last 3 Encounters:  08/18/22 136/88  06/07/22 132/85  02/11/22 119/73    Wt Readings from Last 3 Encounters:  08/18/22 226 lb (102.5 kg)  02/11/22 209 lb 9.6 oz (95.1 kg)  11/10/21 220 lb 11.2 oz (100.1 kg)    Physical Exam Vitals and nursing note reviewed.  Constitutional:      Appearance: Normal appearance. He is well-developed.  HENT:     Head: Normocephalic and atraumatic.     Nose: Nose normal.     Mouth/Throat:     Mouth: Mucous membranes are moist.     Pharynx: Oropharynx is clear.  Eyes:     Extraocular Movements: Extraocular movements intact.     Conjunctiva/sclera: Conjunctivae normal.     Pupils: Pupils are equal, round, and reactive to light.  Cardiovascular:     Rate and Rhythm: Normal rate and regular rhythm.     Pulses: Normal pulses.     Heart sounds: Normal heart sounds.  Pulmonary:     Effort: Pulmonary effort is normal.     Breath sounds: Normal breath sounds.  Abdominal:     Palpations: Abdomen is soft.  Musculoskeletal:        General: Normal range of motion.     Cervical back: Normal range of motion and neck supple.  Lymphadenopathy:     Cervical: No cervical adenopathy.  Skin:    General: Skin is warm and dry.     Capillary Refill: Capillary refill takes less than 2 seconds.  Neurological:     General: No focal deficit present.     Mental Status: He is alert and oriented to person, place, and time.  Psychiatric:        Mood and Affect: Mood normal.        Behavior: Behavior normal.        Thought  Content: Thought content normal.        Judgment: Judgment normal.      Assessment & Plan    1. Acute right-sided low back pain with right-sided sciatica Start celebrex 200 mg twice daily as needed for pain/inflammation  - celecoxib (CELEBREX) 200 MG capsule; Take 1 capsule (200 mg total) by mouth 2 (two) times daily as needed.  Dispense: 60 capsule; Refill: 2  2. Lumbar disc disease with  radiculopathy Add gabapentin 300 mg at bedtime to help radiculopathy. Recommend he follow up with orthopedics as indicated.  - celecoxib (CELEBREX) 200 MG capsule; Take 1 capsule (200 mg total) by mouth 2 (two) times daily as needed.  Dispense: 60 capsule; Refill: 2 - gabapentin (NEURONTIN) 300 MG capsule; Take 1 capsule (300 mg total) by mouth at bedtime.  Dispense: 30 capsule; Refill: 3  3. Primary hypertension Stable. Continue bp medication as prescribed   4. Mixed hyperlipidemia Continue lipitor as prescribed   5. Erectile dysfunction, unspecified erectile dysfunction type Trial cialis - take as prescribed.  - tadalafil (CIALIS) 10 MG tablet; Take 2 tablets (20mg  total) by mouth 1 hour prior to sexual activity.  Dispense: 10 tablet; Refill: 3   Problem List Items Addressed This Visit       Cardiovascular and Mediastinum   Hypertension   Relevant Medications   tadalafil (CIALIS) 10 MG tablet     Nervous and Auditory   Acute right-sided low back pain with right-sided sciatica - Primary   Relevant Medications   celecoxib (CELEBREX) 200 MG capsule   gabapentin (NEURONTIN) 300 MG capsule   Lumbar disc disease with radiculopathy   Relevant Medications   celecoxib (CELEBREX) 200 MG capsule   gabapentin (NEURONTIN) 300 MG capsule     Other   Hyperlipidemia   Relevant Medications   tadalafil (CIALIS) 10 MG tablet   Erectile dysfunction   Relevant Medications   tadalafil (CIALIS) 10 MG tablet     Return in about 6 months (around 02/16/2023) for blood pressure, mood.         Ronnell Freshwater, NP  [facility] [phone] (phone) [phone] (fax)  [place]

## 2022-08-18 ENCOUNTER — Encounter: Payer: Self-pay | Admitting: Nurse Practitioner

## 2022-08-18 ENCOUNTER — Ambulatory Visit: Payer: BC Managed Care – PPO | Admitting: Nurse Practitioner

## 2022-08-18 VITALS — BP 136/88 | HR 80 | Resp 18 | Ht 75.2 in | Wt 226.0 lb

## 2022-08-18 DIAGNOSIS — I10 Essential (primary) hypertension: Secondary | ICD-10-CM

## 2022-08-18 DIAGNOSIS — N529 Male erectile dysfunction, unspecified: Secondary | ICD-10-CM | POA: Diagnosis not present

## 2022-08-18 DIAGNOSIS — E782 Mixed hyperlipidemia: Secondary | ICD-10-CM

## 2022-08-18 DIAGNOSIS — M5116 Intervertebral disc disorders with radiculopathy, lumbar region: Secondary | ICD-10-CM | POA: Diagnosis not present

## 2022-08-18 DIAGNOSIS — M5441 Lumbago with sciatica, right side: Secondary | ICD-10-CM

## 2022-08-18 MED ORDER — GABAPENTIN 300 MG PO CAPS: 300.0000 mg | ORAL_CAPSULE | Freq: Every day | ORAL | 3 refills | Status: DC

## 2022-08-18 MED ORDER — TADALAFIL 10 MG PO TABS: ORAL_TABLET | ORAL | 3 refills | Status: DC

## 2022-08-18 MED ORDER — CELECOXIB 200 MG PO CAPS: 200.0000 mg | ORAL_CAPSULE | Freq: Two times a day (BID) | ORAL | 2 refills | Status: DC | PRN

## 2022-08-30 DIAGNOSIS — F4321 Adjustment disorder with depressed mood: Secondary | ICD-10-CM | POA: Diagnosis not present

## 2022-09-12 DIAGNOSIS — M5116 Intervertebral disc disorders with radiculopathy, lumbar region: Secondary | ICD-10-CM | POA: Insufficient documentation

## 2022-09-12 DIAGNOSIS — M5441 Lumbago with sciatica, right side: Secondary | ICD-10-CM | POA: Insufficient documentation

## 2022-09-13 DIAGNOSIS — F4321 Adjustment disorder with depressed mood: Secondary | ICD-10-CM | POA: Diagnosis not present

## 2022-10-07 ENCOUNTER — Other Ambulatory Visit: Payer: Self-pay | Admitting: Nurse Practitioner

## 2022-10-07 DIAGNOSIS — I10 Essential (primary) hypertension: Secondary | ICD-10-CM

## 2022-10-17 ENCOUNTER — Other Ambulatory Visit: Payer: Self-pay | Admitting: Nurse Practitioner

## 2022-10-17 DIAGNOSIS — M5116 Intervertebral disc disorders with radiculopathy, lumbar region: Secondary | ICD-10-CM

## 2022-10-17 DIAGNOSIS — M5441 Lumbago with sciatica, right side: Secondary | ICD-10-CM

## 2022-10-21 ENCOUNTER — Other Ambulatory Visit: Payer: Self-pay

## 2022-10-21 DIAGNOSIS — E782 Mixed hyperlipidemia: Secondary | ICD-10-CM

## 2022-10-21 MED ORDER — ATORVASTATIN CALCIUM 20 MG PO TABS: 20.0000 mg | ORAL_TABLET | Freq: Every day | ORAL | 1 refills | Status: DC

## 2022-11-08 DIAGNOSIS — F4321 Adjustment disorder with depressed mood: Secondary | ICD-10-CM | POA: Diagnosis not present

## 2022-11-15 ENCOUNTER — Other Ambulatory Visit: Payer: Self-pay

## 2022-11-15 DIAGNOSIS — I10 Essential (primary) hypertension: Secondary | ICD-10-CM

## 2022-11-15 DIAGNOSIS — K219 Gastro-esophageal reflux disease without esophagitis: Secondary | ICD-10-CM

## 2022-11-15 MED ORDER — LOSARTAN POTASSIUM 25 MG PO TABS: ORAL_TABLET | ORAL | 1 refills | Status: DC

## 2022-11-15 MED ORDER — OMEPRAZOLE 20 MG PO CPDR: DELAYED_RELEASE_CAPSULE | ORAL | 1 refills | Status: DC

## 2022-11-22 DIAGNOSIS — F4321 Adjustment disorder with depressed mood: Secondary | ICD-10-CM | POA: Diagnosis not present

## 2022-12-06 DIAGNOSIS — F4321 Adjustment disorder with depressed mood: Secondary | ICD-10-CM | POA: Diagnosis not present

## 2022-12-06 DIAGNOSIS — F840 Autistic disorder: Secondary | ICD-10-CM | POA: Diagnosis not present

## 2022-12-12 ENCOUNTER — Other Ambulatory Visit: Payer: Self-pay | Admitting: Nurse Practitioner

## 2022-12-12 DIAGNOSIS — M5116 Intervertebral disc disorders with radiculopathy, lumbar region: Secondary | ICD-10-CM

## 2022-12-14 ENCOUNTER — Emergency Department: Payer: BC Managed Care – PPO

## 2022-12-14 ENCOUNTER — Emergency Department
Admission: EM | Admit: 2022-12-14 | Discharge: 2022-12-14 | Disposition: A | Discharge status: Home / Self Care | Payer: BC Managed Care – PPO | Attending: Emergency Medicine | Admitting: Emergency Medicine

## 2022-12-14 DIAGNOSIS — Z79899 Other long term (current) drug therapy: Secondary | ICD-10-CM | POA: Insufficient documentation

## 2022-12-14 DIAGNOSIS — R079 Chest pain, unspecified: Secondary | ICD-10-CM | POA: Diagnosis not present

## 2022-12-14 DIAGNOSIS — I10 Essential (primary) hypertension: Secondary | ICD-10-CM | POA: Diagnosis not present

## 2022-12-14 LAB — CBC WITH DIFFERENTIAL/PLATELET
Abs Immature Granulocytes: 0.01 10*3/uL (ref 0.00–0.07)
Basophils Absolute: 0.1 10*3/uL (ref 0.0–0.1)
Basophils Relative: 2 %
Eosinophils Absolute: 0.3 10*3/uL (ref 0.0–0.5)
Eosinophils Relative: 5 %
HCT: 42.4 % (ref 39.0–52.0)
Hemoglobin: 15 g/dL (ref 13.0–17.0)
Immature Granulocytes: 0 %
Lymphocytes Relative: 22 %
Lymphs Abs: 1.5 10*3/uL (ref 0.7–4.0)
MCH: 29.5 pg (ref 26.0–34.0)
MCHC: 35.4 g/dL (ref 30.0–36.0)
MCV: 83.3 fL (ref 80.0–100.0)
Monocytes Absolute: 0.6 10*3/uL (ref 0.1–1.0)
Monocytes Relative: 8 %
Neutro Abs: 4.2 10*3/uL (ref 1.7–7.7)
Neutrophils Relative %: 63 %
Platelets: 269 10*3/uL (ref 150–400)
RBC: 5.09 MIL/uL (ref 4.22–5.81)
RDW: 14.1 % (ref 11.5–15.5)
WBC: 6.6 10*3/uL (ref 4.0–10.5)
nRBC: 0 % (ref 0.0–0.2)

## 2022-12-14 LAB — COMPREHENSIVE METABOLIC PANEL
ALT: 23 U/L (ref 0–44)
AST: 19 U/L (ref 15–41)
Albumin: 4.1 g/dL (ref 3.5–5.0)
Alkaline Phosphatase: 64 U/L (ref 38–126)
Anion gap: 10 (ref 5–15)
BUN: 13 mg/dL (ref 6–20)
CO2: 24 mmol/L (ref 22–32)
Calcium: 9.5 mg/dL (ref 8.9–10.3)
Chloride: 105 mmol/L (ref 98–111)
Creatinine, Ser: 1.3 mg/dL — ABNORMAL HIGH (ref 0.61–1.24)
GFR, Estimated: >60 mL/min (ref >60)
Glucose, Bld: 118 mg/dL — ABNORMAL HIGH (ref 70–99)
Potassium: 3 mmol/L — ABNORMAL LOW (ref 3.5–5.1)
Sodium: 139 mmol/L (ref 135–145)
Total Bilirubin: 0.7 mg/dL (ref 0.3–1.2)
Total Protein: 7.8 g/dL (ref 6.5–8.1)

## 2022-12-14 LAB — TROPONIN I (HIGH SENSITIVITY): Troponin I (High Sensitivity): 10 ng/L (ref <18)

## 2022-12-14 LAB — BRAIN NATRIURETIC PEPTIDE: B Natriuretic Peptide: 77.1 pg/mL (ref 0.0–100.0)

## 2022-12-14 MED ORDER — CLONIDINE HCL 0.1 MG PO TABS
0.1000 mg | ORAL_TABLET | Freq: Once | ORAL | Status: AC
  Administered 2022-12-14: 0.1 mg via ORAL
  Filled 2022-12-14: qty 1

## 2022-12-14 MED ORDER — HYDRALAZINE HCL 20 MG/ML IJ SOLN: 10.0000 mg | Freq: Once | INTRAMUSCULAR | Status: DC

## 2022-12-14 NOTE — Discharge Instructions (Signed)
Please seek medical attention for any high fevers, chest pain, shortness of breath, change in behavior, persistent vomiting, bloody stool or any other new or concerning symptoms.  

## 2022-12-14 NOTE — ED Provider Notes (Signed)
[facility] Provider Note    Event Date/Time   First MD Initiated Contact with Patient 12/14/22 [phone]     (approximate)   History   Hypertension   HPI  Matthew Marquez is a 52 y.o. male who presents to the emergency department today because of concerns for elevated blood pressure.  The patient states he has a history of high blood pressure and does take medication.  Usually when he gets high he is able to control.  When it gets high he does develop headaches as well as chest pressure and he did have both headache and chest pressure this morning.  At the time my exam the chest pressure has resolved but he continues to have the headache.  Located on the right side of his head.  He tried taking his home medication without any significant change in his blood pressure.     Physical Exam   Triage Vital Signs: ED Triage Vitals [12/14/22 0607]  Enc Vitals Group     BP (!) 195/121     Pulse Rate 69     Resp 18     Temp 98.3 F (36.8 C)     Temp Source Oral     SpO2 99 %     Weight 230 lb (104.3 kg)     Height 6\' 3"  (1.905 m)     Head Circumference      Peak Flow      Pain Score 2     Pain Loc      Pain Edu?      Excl. in GC?     Most recent vital signs: Vitals:   12/14/22 0607  BP: (!) 195/121  Pulse: 69  Resp: 18  Temp: 98.3 F (36.8 C)  SpO2: 99%   General: Awake, alert, oriented. CV:  Good peripheral perfusion. Regular rate and rhythm. Resp:  Normal effort. Lungs clear. Abd:  No distention.    ED Results / Procedures / Treatments   Labs (all labs ordered are listed, but only abnormal results are displayed) Labs Reviewed  COMPREHENSIVE METABOLIC PANEL - Abnormal; Notable for the following components:      Result Value   Potassium 3.0 (*)    Glucose, Bld 118 (*)    Creatinine, Ser 1.30 (*)    All other components within normal limits  CBC WITH DIFFERENTIAL/PLATELET  BRAIN NATRIURETIC PEPTIDE  TROPONIN I (HIGH SENSITIVITY)   TROPONIN I (HIGH SENSITIVITY)     EKG  I, Phineas Semen, attending physician, personally viewed and interpreted this EKG  EKG Time: 0621 Rate: 64 Rhythm: sinus rhythm with pac Axis: normal Intervals: qtc 416 QRS: narrow ST changes: no st elevation Impression: abnormal ekg   RADIOLOGY I independently interpreted and visualized the CXR. My interpretation: No pneumonia Radiology interpretation:  IMPRESSION:  No acute cardiopulmonary abnormality. Lower lung volumes with some  underlying chronic pulmonary interstitial changes.      PROCEDURES:  Critical Care performed: No    MEDICATIONS ORDERED IN ED: Medications - No data to display   IMPRESSION / MDM / ASSESSMENT AND PLAN / ED COURSE  I reviewed the triage vital signs and the nursing notes.                              Differential diagnosis includes, but is not limited to, hypertension, ACS  Patient's presentation is most consistent with acute presentation with potential threat to life  or bodily function.  Patient presented to the emergency department today with primary concern for high blood pressure.  Patient was hypertensive here in the emergency department.  Blood work without concerning troponin elevation.  Did show slightly low potassium however patient has history of hypokalemia per previous blood work.  Patient was given medication here in the emergency department his blood pressure did improve. Patient felt improved. Discussed importance of primary care follow up.   FINAL CLINICAL IMPRESSION(S) / ED DIAGNOSES   Final diagnoses:  Hypertension, unspecified type     Note:  This document was prepared using Dragon voice recognition software and may include unintentional dictation errors.    Phineas Semen, MD 12/14/22 [phone]

## 2022-12-14 NOTE — ED Triage Notes (Signed)
Pt ambulatory to triage with c/o hypertension at home. Hx of htn, taking medications as directed, no missed doses. Reports BP 215s at home.  Also c/o chest pressure, that he says is always there, but this morning is worse than normal.  Also states has had increased edema to feet and hands.

## 2022-12-30 IMAGING — CR DG CHEST 2V
1 series · 2 of 2 positions shown · non-contrast
Comparison: Chest x-ray 02/03/2018.

CLINICAL DATA: 50-year-old male with history of chest tightness.

EXAM:
CHEST - 2 VIEW

[Series 1: dg chest 2 view · 0.14mm/px · 2 of 2 slices shown]
[im 1/2]
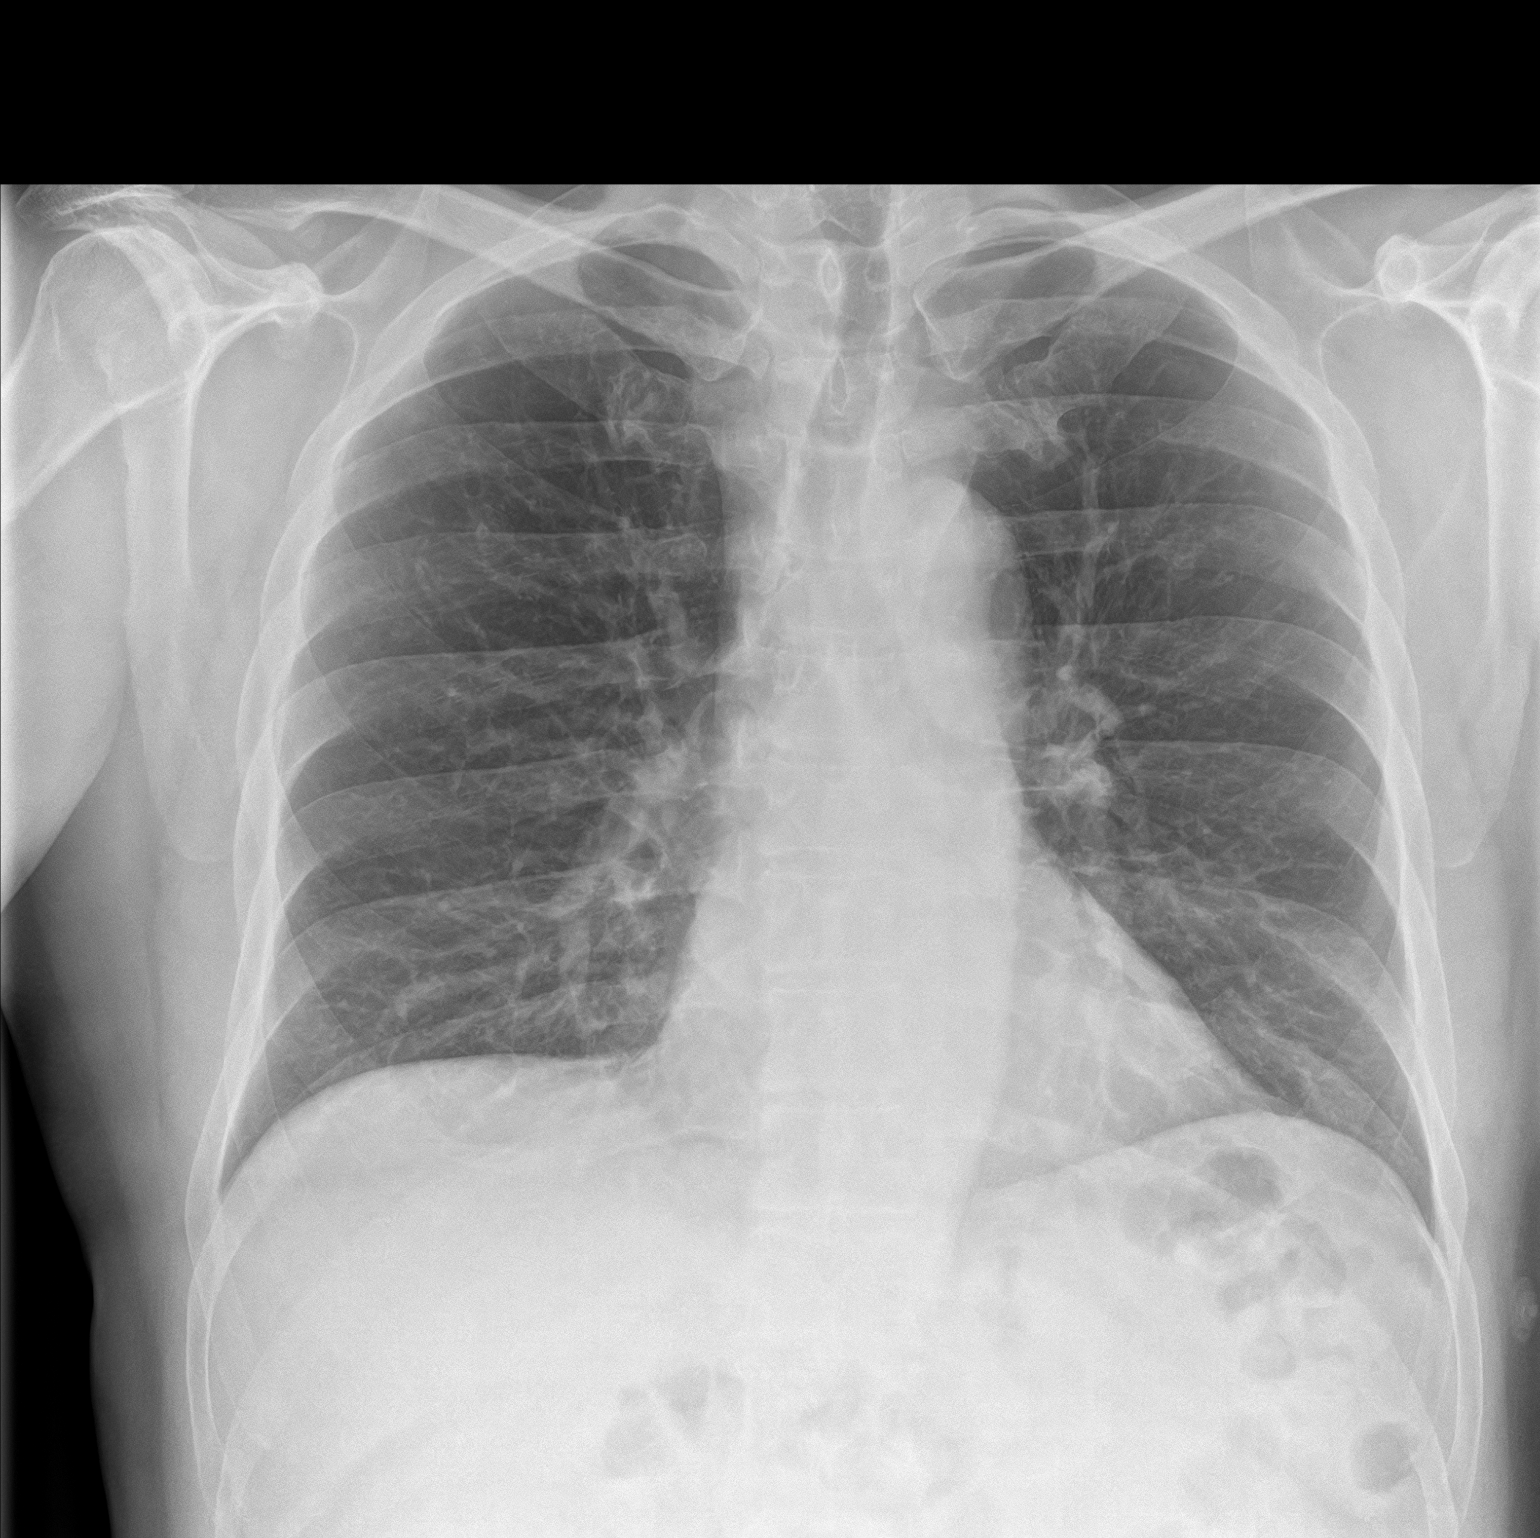
[im 2/2]
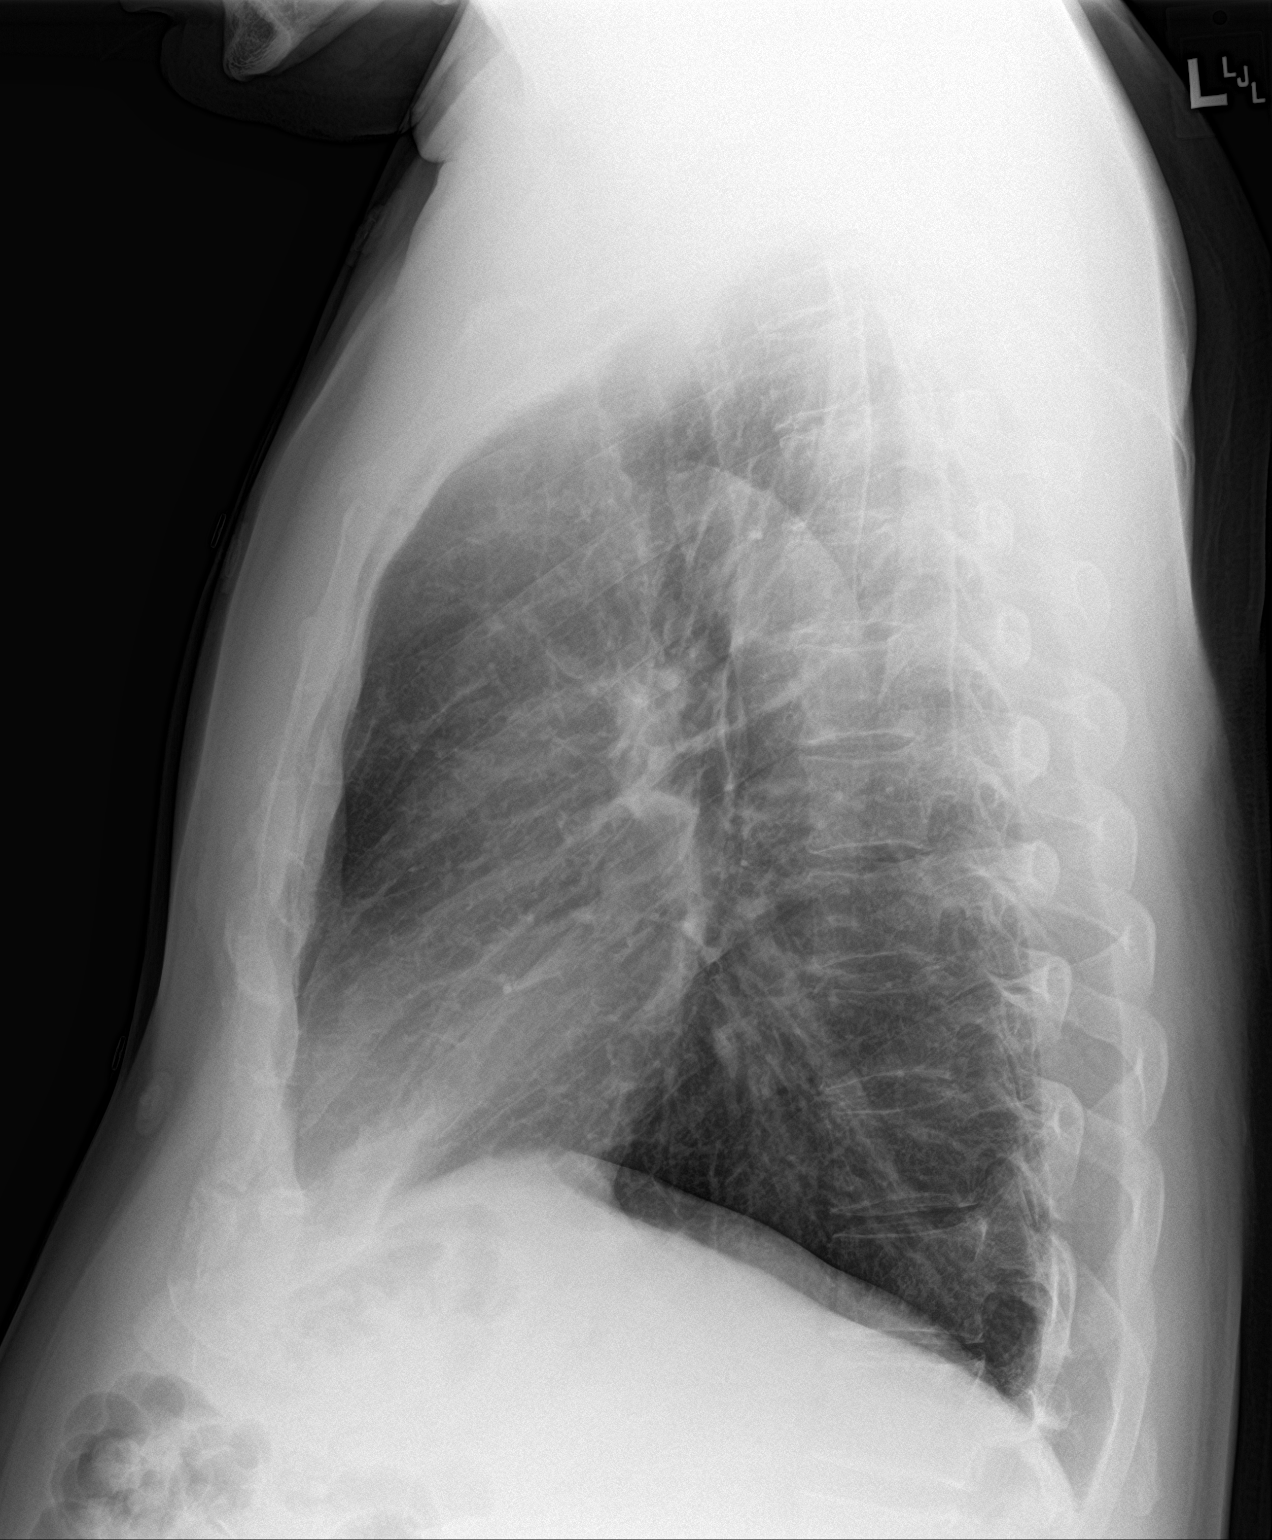

[2 of 2 positions shown; findings below may reference images not displayed]

FINDINGS: Lung volumes are normal. No consolidative airspace disease. No
pleural effusions. No pneumothorax. No pulmonary nodule or mass
noted. Pulmonary vasculature and the cardiomediastinal silhouette
are within normal limits.
IMPRESSION: No radiographic evidence of acute cardiopulmonary disease.

## 2022-12-31 DIAGNOSIS — M545 Low back pain, unspecified: Secondary | ICD-10-CM | POA: Diagnosis not present

## 2023-01-02 ENCOUNTER — Other Ambulatory Visit: Payer: Self-pay | Admitting: Nurse Practitioner

## 2023-01-02 DIAGNOSIS — I10 Essential (primary) hypertension: Secondary | ICD-10-CM

## 2023-01-03 DIAGNOSIS — F4321 Adjustment disorder with depressed mood: Secondary | ICD-10-CM | POA: Diagnosis not present

## 2023-01-07 ENCOUNTER — Other Ambulatory Visit: Payer: Self-pay

## 2023-01-24 DIAGNOSIS — F4321 Adjustment disorder with depressed mood: Secondary | ICD-10-CM | POA: Diagnosis not present

## 2023-02-07 DIAGNOSIS — F4321 Adjustment disorder with depressed mood: Secondary | ICD-10-CM | POA: Diagnosis not present

## 2023-02-16 ENCOUNTER — Encounter: Payer: Self-pay | Admitting: Family Medicine

## 2023-02-16 ENCOUNTER — Ambulatory Visit: Payer: BC Managed Care – PPO | Admitting: Nurse Practitioner

## 2023-02-16 ENCOUNTER — Ambulatory Visit (INDEPENDENT_AMBULATORY_CARE_PROVIDER_SITE_OTHER): Payer: BC Managed Care – PPO | Admitting: Family Medicine

## 2023-02-16 VITALS — BP 144/92 | HR 82 | Resp 18 | Ht 75.0 in | Wt 223.0 lb

## 2023-02-16 DIAGNOSIS — F320 Major depressive disorder, single episode, mild: Secondary | ICD-10-CM | POA: Diagnosis not present

## 2023-02-16 DIAGNOSIS — R635 Abnormal weight gain: Secondary | ICD-10-CM | POA: Diagnosis not present

## 2023-02-16 DIAGNOSIS — R5383 Other fatigue: Secondary | ICD-10-CM

## 2023-02-16 DIAGNOSIS — I10 Essential (primary) hypertension: Secondary | ICD-10-CM | POA: Diagnosis not present

## 2023-02-16 DIAGNOSIS — G4700 Insomnia, unspecified: Secondary | ICD-10-CM | POA: Diagnosis not present

## 2023-02-16 DIAGNOSIS — M5116 Intervertebral disc disorders with radiculopathy, lumbar region: Secondary | ICD-10-CM | POA: Diagnosis not present

## 2023-02-16 MED ORDER — CHLORTHALIDONE 50 MG PO TABS
50.0000 mg | ORAL_TABLET | Freq: Every day | ORAL | 0 refills | Status: AC
Start: 2023-02-16 — End: ?

## 2023-02-16 MED ORDER — CELECOXIB 200 MG PO CAPS: 400.0000 mg | ORAL_CAPSULE | Freq: Every day | ORAL | 2 refills | Status: DC

## 2023-02-16 MED ORDER — CARVEDILOL 12.5 MG PO TABS
12.5000 mg | ORAL_TABLET | Freq: Two times a day (BID) | ORAL | 3 refills | Status: AC
Start: 2023-02-16 — End: 2024-02-16

## 2023-02-16 MED ORDER — ACETAMINOPHEN ER 650 MG PO TBCR
650.0000 mg | EXTENDED_RELEASE_TABLET | Freq: Four times a day (QID) | ORAL | 1 refills | Status: AC | PRN
Start: 2023-02-16 — End: ?

## 2023-02-16 MED ORDER — GABAPENTIN 300 MG PO CAPS
900.0000 mg | ORAL_CAPSULE | Freq: Every day | ORAL | 3 refills | Status: DC
Start: 2023-02-16 — End: 2023-04-14

## 2023-02-16 NOTE — Progress Notes (Signed)
Established Patient Office Visit  Subjective   Patient ID: Matthew Marquez, male    DOB: [birth year]  Age: 52 y.o. MRN: [id number]  Chief Complaint  Patient presents with   Anxiety   Depression   Hypertension   Hip Pain    HPI Matthew Marquez is a 52 y.o. male presenting today for follow up of hypertension, mood.  He is also still in significant pain in his lower back.  He saw orthopedics who recommended MRI, but he was not able to afford it because the out-of-pocket cost was still so high even after insurance.  He was going to the chiropractor which was helpful, but he also cannot afford to keep returning there.  Additionally, the pain is impacting his ability to sleep.  He is struggled with insomnia for a long time resulting in daytime fatigue, but his back is aggravating his insomnia and increasing daytime fatigue.  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  He reports that he does not eat a lot but cannot seem to lose any weight. Hypertension: Patient here for follow-up of elevated blood pressure.   Pt denies chest pain, SOB, dizziness, edema, syncope, fatigue or heart palpitations. Taking carvedilol and chlorthalidone, reports excellent compliance with treatment. Denies side effects. Mood: Patient reports that he is often irritable, tired.  He is not sleeping well, which may be affecting his mood.     02/16/2023    9:40 AM 08/18/2022   10:04 AM 02/11/2022    8:37 AM  Depression screen PHQ 2/9  Decreased Interest 2 1 1   Down, Depressed, Hopeless 0 0 0  PHQ - 2 Score 2 1 1   Altered sleeping 3 1 1   Tired, decreased energy 3 1 0  Change in appetite 0 3 1  Feeling bad or failure about yourself  0 1 1  Trouble concentrating 2 0 0  Moving slowly or fidgety/restless 0 0 0  Suicidal thoughts 0 0 0  PHQ-9 Score 10 7 4   Difficult doing work/chores Somewhat difficult Not difficult at all        02/16/2023    9:41 AM  08/18/2022   10:04 AM 02/11/2022    8:38 AM 11/10/2021    2:08 PM  GAD 7 : Generalized Anxiety Score  Nervous, Anxious, on Edge 1 0 1 0  Control/stop worrying 0 0 0 1  Worry too much - different things 0 0 1 0  Trouble relaxing 2 1 1 1   Restless 1 1 1  0  Easily annoyed or irritable 2 0 1 1  Afraid - awful might happen 0 0 0 0  Total GAD 7 Score 6 2 5 3   Anxiety Difficulty Somewhat difficult Not difficult at all     ROS Negative unless otherwise noted in HPI   Objective:     BP (!) 144/92 (BP Location: Left Arm, Patient Position: Sitting, Cuff Size: Large)   Pulse 82   Resp 18   Ht 6\' 3"  (1.905 m)   Wt 223 lb (101.2 kg)   SpO2 97%   BMI 27.87 kg/m   Physical Exam Constitutional:      General: He is not in acute distress.    Appearance: Normal appearance.  HENT:     Head: Normocephalic and atraumatic.  Cardiovascular:     Rate and Rhythm: Normal rate and regular rhythm.     Heart sounds: Normal heart  sounds. No murmur heard.    No friction rub. No gallop.  Pulmonary:     Effort: Pulmonary effort is normal. No respiratory distress.     Breath sounds: Normal breath sounds. No wheezing, rhonchi or rales.  Skin:    General: Skin is warm and dry.  Neurological:     Mental Status: He is alert and oriented to person, place, and time.  Psychiatric:        Mood and Affect: Mood normal.      Assessment & Plan:  Primary hypertension Assessment & Plan: BP goal <140/90.  Blood pressure slightly above goal in office, 144/92.  Waiting to change blood pressure medication until pain is under better control.  Continue carvedilol 12.5 mg twice daily, chlorthalidone 50 mg daily.  Checking CMP with labs today for medication monitoring.  At follow-up appointment will reassess if changing medication is necessary.   Orders: -     CBC with Differential/Platelet; Future -     Comprehensive metabolic panel; Future -     Carvedilol; Take 1 tablet (12.5 mg total) by mouth 2 (two) times  daily.  Dispense: 180 tablet; Refill: 3 -     Chlorthalidone; Take 1 tablet (50 mg total) by mouth daily.  Dispense: 90 tablet; Refill: 0  MDD (major depressive disorder), single episode, mild (HCC) Assessment & Plan: PHQ-9 score 10, GAD-7 score 6.  Increasing gabapentin to 900 mg nightly at bedtime to help with back pain and potentially anxiety.  We are also going to address his difficulties with sleep starting with a sleep study.  We may consider adding an antidepressant at his follow-up visit once sleep study has been completed.   Insomnia, unspecified type Assessment & Plan: Starting with home sleep study to rule out obstructive sleep apnea.  We are also addressing back pain which has been affecting sleep.  Once sleep study has been conducted and back pain is under better control, we may consider a medication that can also be effective for depression/anxiety such as quetiapine, trazodone, or mirtazapine.  Orders: -     Home sleep test; Future  Lumbar disc disease with radiculopathy Assessment & Plan: Patient previously saw orthopedics for evaluation.  They wanted to do an MRI, but even with insurance it would be $800 out-of-pocket which he cannot afford right now.  Advised him not to take celecoxib at the same time as ibuprofen.  Instead, recommend acetaminophen 650 mg every 6 hours during the day, celecoxib maximum dose 400 mg once at bedtime, gabapentin 900 mg at bedtime.  I also provided some stretches and strengthening exercises for him to try at home.  Orders: -     Gabapentin; Take 3 capsules (900 mg total) by mouth at bedtime.  Dispense: 30 capsule; Refill: 3 -     Celecoxib; Take 2 capsules (400 mg total) by mouth at bedtime.  Dispense: 60 capsule; Refill: 2 -     Acetaminophen ER; Take 1 tablet (650 mg total) by mouth every 6 (six) hours as needed for pain.  Dispense: 120 tablet; Refill: 1  Fatigue, unspecified type -     VITAMIN D 25 Hydroxy (Vit-D Deficiency, Fractures);  Future -     T4, free; Future -     TSH; Future -     Hemoglobin A1c; Future -     Home sleep test; Future  Weight gain -     VITAMIN D 25 Hydroxy (Vit-D Deficiency, Fractures); Future -     T4, free;  Future -     TSH; Future -     Hemoglobin A1c; Future  Starting workup of fatigue, insomnia, weight gain with lab work and a home sleep test.  Patient verbalized understanding and is agreeable to this plan.  Return in about 3 months (around 05/19/2023) for follow-up for back pain, HTN, mood.    Melida Quitter, PA

## 2023-02-16 NOTE — Patient Instructions (Signed)
PAIN: -Take acetaminophen (Tylenol) prescribed every 5-6 hours each day. Do not exceed a total of 6 tablets daily. -Take celecoxib (Celebrex) 2 tablets nightly before bed. -Take gabapentin (Neurontin) 3 tablets nightly before bed.

## 2023-02-16 NOTE — Assessment & Plan Note (Signed)
Starting with home sleep study to rule out obstructive sleep apnea.  We are also addressing back pain which has been affecting sleep.  Once sleep study has been conducted and back pain is under better control, we may consider a medication that can also be effective for depression/anxiety such as quetiapine, trazodone, or mirtazapine.

## 2023-02-16 NOTE — Assessment & Plan Note (Signed)
BP goal <140/90.  Blood pressure slightly above goal in office, 144/92.  Waiting to change blood pressure medication until pain is under better control.  Continue carvedilol 12.5 mg twice daily, chlorthalidone 50 mg daily.  Checking CMP with labs today for medication monitoring.  At follow-up appointment will reassess if changing medication is necessary.

## 2023-02-16 NOTE — Assessment & Plan Note (Signed)
PHQ-9 score 10, GAD-7 score 6.  Increasing gabapentin to 900 mg nightly at bedtime to help with back pain and potentially anxiety.  We are also going to address his difficulties with sleep starting with a sleep study.  We may consider adding an antidepressant at his follow-up visit once sleep study has been completed.

## 2023-02-16 NOTE — Assessment & Plan Note (Signed)
Patient previously saw orthopedics for evaluation.  They wanted to do an MRI, but even with insurance it would be $800 out-of-pocket which he cannot afford right now.  Advised him not to take celecoxib at the same time as ibuprofen.  Instead, recommend acetaminophen 650 mg every 6 hours during the day, celecoxib maximum dose 400 mg once at bedtime, gabapentin 900 mg at bedtime.  I also provided some stretches and strengthening exercises for him to try at home.

## 2023-02-17 LAB — T4, FREE: Free T4: 1.21 ng/dL (ref 0.82–1.77)

## 2023-02-17 LAB — TSH: TSH: 1.06 u[IU]/mL (ref 0.450–4.500)

## 2023-02-17 LAB — CBC WITH DIFFERENTIAL/PLATELET
Basophils Absolute: 0.1 10*3/uL (ref 0.0–0.2)
Basos: 1 %
EOS (ABSOLUTE): 0.2 10*3/uL (ref 0.0–0.4)
Eos: 3 %
Hematocrit: 43.8 % (ref 37.5–51.0)
Hemoglobin: 14.9 g/dL (ref 13.0–17.7)
Immature Grans (Abs): 0 10*3/uL (ref 0.0–0.1)
Immature Granulocytes: 0 %
Lymphocytes Absolute: 1.2 10*3/uL (ref 0.7–3.1)
Lymphs: 18 %
MCH: 30.2 pg (ref 26.6–33.0)
MCHC: 34 g/dL (ref 31.5–35.7)
MCV: 89 fL (ref 79–97)
Monocytes Absolute: 0.6 10*3/uL (ref 0.1–0.9)
Monocytes: 9 %
Neutrophils Absolute: 4.5 10*3/uL (ref 1.4–7.0)
Neutrophils: 69 %
Platelets: 272 10*3/uL (ref 150–450)
RBC: 4.94 x10E6/uL (ref 4.14–5.80)
RDW: 14.7 % (ref 11.6–15.4)
WBC: 6.5 10*3/uL (ref 3.4–10.8)

## 2023-02-17 LAB — COMPREHENSIVE METABOLIC PANEL
ALT: 19 IU/L (ref 0–44)
AST: 16 IU/L (ref 0–40)
Albumin: 4.9 g/dL (ref 3.8–4.9)
Alkaline Phosphatase: 76 IU/L (ref 44–121)
BUN/Creatinine Ratio: 13 (ref 9–20)
BUN: 18 mg/dL (ref 6–24)
Bilirubin Total: 0.4 mg/dL (ref 0.0–1.2)
CO2: 24 mmol/L (ref 20–29)
Calcium: 10.5 mg/dL — ABNORMAL HIGH (ref 8.7–10.2)
Chloride: 103 mmol/L (ref 96–106)
Creatinine, Ser: 1.43 mg/dL — ABNORMAL HIGH (ref 0.76–1.27)
Globulin, Total: 2.3 g/dL (ref 1.5–4.5)
Glucose: 105 mg/dL — ABNORMAL HIGH (ref 70–99)
Potassium: 3.3 mmol/L — ABNORMAL LOW (ref 3.5–5.2)
Sodium: 143 mmol/L (ref 134–144)
Total Protein: 7.2 g/dL (ref 6.0–8.5)
eGFR: 59 mL/min/{1.73_m2} — ABNORMAL LOW (ref >59)

## 2023-02-17 LAB — VITAMIN D 25 HYDROXY (VIT D DEFICIENCY, FRACTURES): Vit D, 25-Hydroxy: 35.8 ng/mL (ref 30.0–100.0)

## 2023-02-17 LAB — HEMOGLOBIN A1C
Est. average glucose Bld gHb Est-mCnc: 117 mg/dL
Hgb A1c MFr Bld: 5.7 % — ABNORMAL HIGH (ref 4.8–5.6)

## 2023-02-18 MED ORDER — LOSARTAN POTASSIUM 50 MG PO TABS
50.00 mg | ORAL_TABLET | Freq: Every day | ORAL | 1 refills | Status: AC
Start: 2023-02-18 — End: ?

## 2023-02-18 NOTE — Addendum Note (Signed)
Addended by: Saralyn Pilar on: 02/18/2023 11:27 AM   Modules accepted: Orders

## 2023-02-28 DIAGNOSIS — F4321 Adjustment disorder with depressed mood: Secondary | ICD-10-CM | POA: Diagnosis not present

## 2023-03-14 DIAGNOSIS — F4321 Adjustment disorder with depressed mood: Secondary | ICD-10-CM | POA: Diagnosis not present

## 2023-03-28 DIAGNOSIS — F4321 Adjustment disorder with depressed mood: Secondary | ICD-10-CM | POA: Diagnosis not present

## 2023-04-04 DIAGNOSIS — R0981 Nasal congestion: Secondary | ICD-10-CM | POA: Diagnosis not present

## 2023-04-04 DIAGNOSIS — U071 COVID-19: Secondary | ICD-10-CM | POA: Diagnosis not present

## 2023-04-14 ENCOUNTER — Other Ambulatory Visit: Payer: Self-pay | Admitting: Nurse Practitioner

## 2023-04-14 DIAGNOSIS — M5116 Intervertebral disc disorders with radiculopathy, lumbar region: Secondary | ICD-10-CM

## 2023-04-25 DIAGNOSIS — F4321 Adjustment disorder with depressed mood: Secondary | ICD-10-CM | POA: Diagnosis not present

## 2023-05-09 DIAGNOSIS — F4321 Adjustment disorder with depressed mood: Secondary | ICD-10-CM | POA: Diagnosis not present

## 2023-05-12 ENCOUNTER — Other Ambulatory Visit: Payer: Self-pay

## 2023-05-12 DIAGNOSIS — R519 Headache, unspecified: Secondary | ICD-10-CM

## 2023-05-12 DIAGNOSIS — K219 Gastro-esophageal reflux disease without esophagitis: Secondary | ICD-10-CM

## 2023-05-12 MED ORDER — OMEPRAZOLE 20 MG PO CPDR
DELAYED_RELEASE_CAPSULE | ORAL | 1 refills | Status: DC
Start: 2023-05-12 — End: 2023-11-17

## 2023-05-14 ENCOUNTER — Other Ambulatory Visit: Payer: Self-pay | Admitting: Family Medicine

## 2023-05-14 DIAGNOSIS — M5116 Intervertebral disc disorders with radiculopathy, lumbar region: Secondary | ICD-10-CM

## 2023-05-16 DIAGNOSIS — R0989 Other specified symptoms and signs involving the circulatory and respiratory systems: Secondary | ICD-10-CM | POA: Diagnosis not present

## 2023-05-16 DIAGNOSIS — Z8616 Personal history of COVID-19: Secondary | ICD-10-CM | POA: Diagnosis not present

## 2023-05-16 DIAGNOSIS — R053 Chronic cough: Secondary | ICD-10-CM | POA: Diagnosis not present

## 2023-05-16 DIAGNOSIS — I509 Heart failure, unspecified: Secondary | ICD-10-CM | POA: Insufficient documentation

## 2023-05-16 DIAGNOSIS — S0993XA Unspecified injury of face, initial encounter: Secondary | ICD-10-CM | POA: Diagnosis not present

## 2023-05-16 DIAGNOSIS — R062 Wheezing: Secondary | ICD-10-CM | POA: Diagnosis not present

## 2023-05-16 DIAGNOSIS — R519 Headache, unspecified: Secondary | ICD-10-CM | POA: Diagnosis not present

## 2023-05-16 DIAGNOSIS — I10 Essential (primary) hypertension: Secondary | ICD-10-CM | POA: Insufficient documentation

## 2023-05-16 DIAGNOSIS — M25551 Pain in right hip: Secondary | ICD-10-CM | POA: Diagnosis not present

## 2023-05-19 ENCOUNTER — Ambulatory Visit (INDEPENDENT_AMBULATORY_CARE_PROVIDER_SITE_OTHER): Payer: BC Managed Care – PPO | Admitting: Family Medicine

## 2023-05-19 ENCOUNTER — Encounter: Payer: Self-pay | Admitting: Family Medicine

## 2023-05-19 VITALS — BP 133/84 | HR 67 | Resp 18 | Ht 75.0 in | Wt 231.0 lb

## 2023-05-19 DIAGNOSIS — Z1159 Encounter for screening for other viral diseases: Secondary | ICD-10-CM | POA: Diagnosis not present

## 2023-05-19 DIAGNOSIS — R635 Abnormal weight gain: Secondary | ICD-10-CM | POA: Diagnosis not present

## 2023-05-19 DIAGNOSIS — F320 Major depressive disorder, single episode, mild: Secondary | ICD-10-CM

## 2023-05-19 DIAGNOSIS — I10 Essential (primary) hypertension: Secondary | ICD-10-CM | POA: Diagnosis not present

## 2023-05-19 DIAGNOSIS — E782 Mixed hyperlipidemia: Secondary | ICD-10-CM | POA: Diagnosis not present

## 2023-05-19 DIAGNOSIS — M5116 Intervertebral disc disorders with radiculopathy, lumbar region: Secondary | ICD-10-CM

## 2023-05-19 DIAGNOSIS — N529 Male erectile dysfunction, unspecified: Secondary | ICD-10-CM

## 2023-05-19 MED ORDER — TADALAFIL 10 MG PO TABS: ORAL_TABLET | ORAL | 3 refills | Status: DC

## 2023-05-19 MED ORDER — TADALAFIL 10 MG PO TABS
ORAL_TABLET | ORAL | 3 refills | Status: DC
Start: 2023-05-19 — End: 2023-05-19

## 2023-05-19 MED ORDER — ATORVASTATIN CALCIUM 20 MG PO TABS: 20.0000 mg | ORAL_TABLET | Freq: Every day | ORAL | 1 refills | Status: DC

## 2023-05-19 MED ORDER — GABAPENTIN 300 MG PO CAPS: 900.0000 mg | ORAL_CAPSULE | Freq: Every day | ORAL | 1 refills | Status: DC

## 2023-05-19 MED ORDER — CARVEDILOL 12.5 MG PO TABS: 12.5000 mg | ORAL_TABLET | Freq: Two times a day (BID) | ORAL | 3 refills | Status: DC

## 2023-05-19 MED ORDER — GABAPENTIN 300 MG PO CAPS
900.0000 mg | ORAL_CAPSULE | Freq: Every day | ORAL | 1 refills | Status: AC
Start: 2023-05-19 — End: ?

## 2023-05-19 MED ORDER — LOSARTAN POTASSIUM 50 MG PO TABS: 50.0000 mg | ORAL_TABLET | Freq: Every day | ORAL | 1 refills | Status: DC

## 2023-05-19 MED ORDER — CHLORTHALIDONE 25 MG PO TABS
25.0000 mg | ORAL_TABLET | Freq: Every day | ORAL | 1 refills | Status: AC
Start: 2023-05-19 — End: ?

## 2023-05-19 MED ORDER — TADALAFIL 10 MG PO TABS: ORAL_TABLET | ORAL | 3 refills | Status: AC

## 2023-05-19 MED ORDER — CHLORTHALIDONE 25 MG PO TABS
25.0000 mg | ORAL_TABLET | Freq: Every day | ORAL | 1 refills | Status: DC
Start: 2023-05-19 — End: 2023-05-19

## 2023-05-19 NOTE — Progress Notes (Signed)
Established Patient Office Visit  Subjective   Patient ID: Matthew Marquez, male    DOB: 30-Jan-1971  Age: 52 y.o. MRN: [id number]  Chief Complaint  Patient presents with   Back Pain   Hypertension   Anxiety   Depression    HPI EZZARD Marquez is a 52 y.o. male presenting today for follow up of hypertension, back pain, mood. Hypertension: Patient here for follow-up of elevated blood pressure. He is very active and has a physically strenuous job.   Pt denies chest pain, SOB, dizziness, edema, syncope, fatigue or heart palpitations. Taking losartan, chlorthalidone, carvedilol, reports excellent compliance with treatment. Denies side effects. Back pain: He still struggles with back pain which he believes is related to abdominal fat.  He is very active at his job and does not eat a lot but cannot seem to lose any weight.  He states that in a typical day, he gets up and has coffee, a mini muffin, and a cinnamon roll.  At lunch he will have a 6 inch sandwich from [employer] and gets 2 cookies, 1 to eat with lunch and 1 to save for later.  For dinner he will have a sandwich at home. Mood: Patient is here to follow up for depression and anxiety, currently managing with gabapentin 900 mg nightly at bedtime. Taking medication without side effects, reports excellent compliance with treatment. Denies mood changes or SI/HI. He feels mood is worse since last visit.     05/19/2023    9:22 AM 02/16/2023    9:40 AM 08/18/2022   10:04 AM  Depression screen PHQ 2/9  Decreased Interest 0 2 1  Down, Depressed, Hopeless 0 0 0  PHQ - 2 Score 0 2 1  Altered sleeping 0 3 1  Tired, decreased energy 0 3 1  Change in appetite 0 0 3  Feeling bad or failure about yourself  0 0 1  Trouble concentrating 0 2 0  Moving slowly or fidgety/restless 0 0 0  Suicidal thoughts 0 0 0  PHQ-9 Score 0 10 7  Difficult doing work/chores Not difficult at all Somewhat difficult Not difficult at all       02/16/2023    9:41 AM  08/18/2022   10:04 AM 02/11/2022    8:38 AM 11/10/2021    2:08 PM  GAD 7 : Generalized Anxiety Score  Nervous, Anxious, on Edge 1 0 1 0  Control/stop worrying 0 0 0 1  Worry too much - different things 0 0 1 0  Trouble relaxing 2 1 1 1   Restless 1 1 1  0  Easily annoyed or irritable 2 0 1 1  Afraid - awful might happen 0 0 0 0  Total GAD 7 Score 6 2 5 3   Anxiety Difficulty Somewhat difficult Not difficult at all     Outpatient Medications Prior to Visit  Medication Sig   acetaminophen (TYLENOL 8 HOUR) 650 MG CR tablet Take 1 tablet (650 mg total) by mouth every 6 (six) hours as needed for pain.   albuterol (VENTOLIN HFA) 108 (90 Base) MCG/ACT inhaler Inhale 2 puffs into the lungs every 4 (four) hours as needed for shortness of breath.   celecoxib (CELEBREX) 200 MG capsule TAKE 2 CAPSULES(400 MG) BY MOUTH AT BEDTIME   omeprazole (PRILOSEC) 20 MG capsule TAKE 1 CAPSULE(20 MG) BY MOUTH DAILY   SUMAtriptan (IMITREX) 50 MG tablet May repeat in 2 hours if headache persists or recurs.   [DISCONTINUED] amoxicillin-clavulanate (AUGMENTIN) 875-125 MG  tablet Take 1 tablet by mouth 2 (two) times daily.   [DISCONTINUED] atorvastatin (LIPITOR) 20 MG tablet Take 1 tablet (20 mg total) by mouth once daily.   [DISCONTINUED] carvedilol (COREG) 12.5 MG tablet Take 1 tablet (12.5 mg total) by mouth 2 (two) times daily.   [DISCONTINUED] chlorthalidone (HYGROTON) 50 MG tablet Take 1 tablet (50 mg total) by mouth daily.   [DISCONTINUED] gabapentin (NEURONTIN) 300 MG capsule TAKE 1 CAPSULE(300 MG) BY MOUTH AT BEDTIME   [DISCONTINUED] losartan (COZAAR) 50 MG tablet Take 1 tablet (50 mg total) by mouth daily.   [DISCONTINUED] tadalafil (CIALIS) 10 MG tablet Take 2 tablets (20mg  total) by mouth 1 hour prior to sexual activity.   No facility-administered medications prior to visit.    ROS Negative unless otherwise noted in HPI   Objective:     BP 133/84 (BP Location: Left Arm, Patient Position: Sitting, Cuff  Size: Normal)   Pulse 67   Resp 18   Ht 6\' 3"  (1.905 m)   Wt 231 lb (104.8 kg)   SpO2 99%   BMI 28.87 kg/m   Physical Exam Constitutional:      General: He is not in acute distress.    Appearance: Normal appearance.  HENT:     Head: Normocephalic and atraumatic.  Cardiovascular:     Rate and Rhythm: Normal rate and regular rhythm.     Heart sounds: Normal heart sounds. No murmur heard.    No friction rub. No gallop.  Pulmonary:     Effort: Pulmonary effort is normal. No respiratory distress.     Breath sounds: Normal breath sounds. No wheezing, rhonchi or rales.  Skin:    General: Skin is warm and dry.  Neurological:     Mental Status: He is alert and oriented to person, place, and time.  Psychiatric:        Mood and Affect: Mood normal.     Assessment & Plan:  Primary hypertension Assessment & Plan: BP goal <140/90.  Stable, at goal.  Continue losartan 50 mg daily, carvedilol 12.5 mg twice daily, chlorthalidone 25 mg daily.  Most recent CMP with elevated creatinine, slightly low GFR, and slightly low potassium.  Encourage adequate hydration, repeat labs.  Orders: -     CBC with Differential/Platelet; Future -     Comprehensive metabolic panel; Future -     Losartan Potassium; Take 1 tablet (50 mg total) by mouth daily.  Dispense: 90 tablet; Refill: 1 -     Chlorthalidone; Take 1 tablet (25 mg total) by mouth daily.  Dispense: 90 tablet; Refill: 1 -     Carvedilol; Take 1 tablet (12.5 mg total) by mouth 2 (two) times daily.  Dispense: 180 tablet; Refill: 3  Mixed hyperlipidemia Assessment & Plan: Last lipid panel: LDL 72, HDL 32, triglycerides 205.  Repeat lipid panel and CMP.  Continue atorvastatin 20 mg daily, will continue to monitor.  Orders: -     CBC with Differential/Platelet; Future -     Comprehensive metabolic panel; Future -     Atorvastatin Calcium; Take 1 tablet (20 mg total) by mouth once daily.  Dispense: 90 tablet; Refill: 1  MDD (major depressive  disorder), single episode, mild (HCC) Assessment & Plan: PHQ-9 score 0, GAD-7 score 6.  Stable.  Continue gabapentin 900 mg nightly at bedtime to help with back pain and potentially anxiety.  Will continue to monitor.   Screening for viral disease -     Hepatitis C antibody; Future -  HIV Antibody (routine testing w rflx); Future  Weight gain -     Hemoglobin A1c; Future  Erectile dysfunction, unspecified erectile dysfunction type -     Tadalafil; Take 2 tablets (20mg  total) by mouth 1 hour prior to sexual activity.  Dispense: 10 tablet; Refill: 3  Lumbar disc disease with radiculopathy -     Gabapentin; Take 3 capsules (900 mg total) by mouth at bedtime.  Dispense: 270 capsule; Refill: 1  Discussed the importance of eating a balanced diet and limiting meals/snacks that are only carbs or sugary foods.  Encouraged to increase intake of fiber and protein.  Provided handouts on creating a balanced plate and the fiber content of food.  Encouraged to focus on nutrition for weight management efforts.  Return in about 4 months (around 09/19/2023) for annual physical, fasting blood work 1 week before.    Melida Quitter, PA

## 2023-05-19 NOTE — Assessment & Plan Note (Signed)
Last lipid panel: LDL 72, HDL 32, triglycerides 205.  Repeat lipid panel and CMP.  Continue atorvastatin 20 mg daily, will continue to monitor.

## 2023-05-19 NOTE — Assessment & Plan Note (Addendum)
BP goal <140/90.  Stable, at goal.  Continue losartan 50 mg daily, carvedilol 12.5 mg twice daily, chlorthalidone 25 mg daily.  Most recent CMP with elevated creatinine, slightly low GFR, and slightly low potassium.  Encourage adequate hydration, repeat labs.

## 2023-05-19 NOTE — Patient Instructions (Addendum)
WEIGHT MANAGEMENT GOALS: Eat a balanced plate for each meal.  If you do have a food that is high in sugar or carbohydrates, make sure that you are also eating something with fiber and it and/or protein in it to balance your blood sugars. Limit foods that are high in sugar and simple carbohydrates.  Have whole-wheat options for complex carbohydrates instead. Aim for 80-90 g of protein each day. Aim for 30 g of fiber each day. Aim for at least 64 ounces of water each day.

## 2023-05-19 NOTE — Assessment & Plan Note (Signed)
PHQ-9 score 0, GAD-7 score 6.  Stable.  Continue gabapentin 900 mg nightly at bedtime to help with back pain and potentially anxiety.  Will continue to monitor.

## 2023-05-20 ENCOUNTER — Other Ambulatory Visit: Payer: Self-pay | Admitting: Family Medicine

## 2023-05-20 ENCOUNTER — Encounter: Payer: Self-pay | Admitting: Family Medicine

## 2023-05-20 DIAGNOSIS — N183 Chronic kidney disease, stage 3 unspecified: Secondary | ICD-10-CM | POA: Insufficient documentation

## 2023-05-20 DIAGNOSIS — N1831 Chronic kidney disease, stage 3a: Secondary | ICD-10-CM

## 2023-05-20 LAB — COMPREHENSIVE METABOLIC PANEL
ALT: 20 [IU]/L (ref 0–44)
AST: 14 [IU]/L (ref 0–40)
Albumin: 4.3 g/dL (ref 3.8–4.9)
Alkaline Phosphatase: 69 [IU]/L (ref 44–121)
BUN/Creatinine Ratio: 23 — ABNORMAL HIGH (ref 9–20)
BUN: 38 mg/dL — ABNORMAL HIGH (ref 6–24)
Bilirubin Total: 0.3 mg/dL (ref 0.0–1.2)
CO2: 24 mmol/L (ref 20–29)
Calcium: 9.5 mg/dL (ref 8.7–10.2)
Chloride: 102 mmol/L (ref 96–106)
Creatinine, Ser: 1.63 mg/dL — ABNORMAL HIGH (ref 0.76–1.27)
Globulin, Total: 2.7 g/dL (ref 1.5–4.5)
Glucose: 121 mg/dL — ABNORMAL HIGH (ref 70–99)
Potassium: 3.4 mmol/L — ABNORMAL LOW (ref 3.5–5.2)
Sodium: 142 mmol/L (ref 134–144)
Total Protein: 7 g/dL (ref 6.0–8.5)
eGFR: 50 mL/min/{1.73_m2} — ABNORMAL LOW (ref >59)

## 2023-05-20 LAB — CBC WITH DIFFERENTIAL/PLATELET
Basophils Absolute: 0.1 10*3/uL (ref 0.0–0.2)
Basos: 1 %
EOS (ABSOLUTE): 0 10*3/uL (ref 0.0–0.4)
Eos: 0 %
Hematocrit: 42.5 % (ref 37.5–51.0)
Hemoglobin: 13.9 g/dL (ref 13.0–17.7)
Immature Grans (Abs): 0.1 10*3/uL (ref 0.0–0.1)
Immature Granulocytes: 1 %
Lymphocytes Absolute: 1.3 10*3/uL (ref 0.7–3.1)
Lymphs: 12 %
MCH: 29.4 pg (ref 26.6–33.0)
MCHC: 32.7 g/dL (ref 31.5–35.7)
MCV: 90 fL (ref 79–97)
Monocytes Absolute: 0.5 10*3/uL (ref 0.1–0.9)
Monocytes: 5 %
Neutrophils Absolute: 9.1 10*3/uL — ABNORMAL HIGH (ref 1.4–7.0)
Neutrophils: 81 %
Platelets: 293 10*3/uL (ref 150–450)
RBC: 4.73 x10E6/uL (ref 4.14–5.80)
RDW: 13.1 % (ref 11.6–15.4)
WBC: 11 10*3/uL — ABNORMAL HIGH (ref 3.4–10.8)

## 2023-05-20 LAB — HEPATITIS C ANTIBODY: Hep C Virus Ab: NONREACTIVE

## 2023-05-20 LAB — HIV ANTIBODY (ROUTINE TESTING W REFLEX): HIV Screen 4th Generation wRfx: NONREACTIVE

## 2023-05-20 LAB — HEMOGLOBIN A1C
Est. average glucose Bld gHb Est-mCnc: 123 mg/dL
Hgb A1c MFr Bld: 5.9 % — ABNORMAL HIGH (ref 4.8–5.6)

## 2023-05-23 DIAGNOSIS — F4321 Adjustment disorder with depressed mood: Secondary | ICD-10-CM | POA: Diagnosis not present

## 2023-06-06 DIAGNOSIS — F4321 Adjustment disorder with depressed mood: Secondary | ICD-10-CM | POA: Diagnosis not present

## 2023-06-12 ENCOUNTER — Other Ambulatory Visit: Payer: Self-pay | Admitting: Family Medicine

## 2023-06-12 DIAGNOSIS — M5116 Intervertebral disc disorders with radiculopathy, lumbar region: Secondary | ICD-10-CM

## 2023-06-13 DIAGNOSIS — F4321 Adjustment disorder with depressed mood: Secondary | ICD-10-CM | POA: Diagnosis not present

## 2023-07-15 ENCOUNTER — Other Ambulatory Visit: Payer: Self-pay | Admitting: Family Medicine

## 2023-07-15 DIAGNOSIS — M5116 Intervertebral disc disorders with radiculopathy, lumbar region: Secondary | ICD-10-CM

## 2023-07-31 DIAGNOSIS — M7989 Other specified soft tissue disorders: Secondary | ICD-10-CM | POA: Diagnosis not present

## 2023-07-31 DIAGNOSIS — M19071 Primary osteoarthritis, right ankle and foot: Secondary | ICD-10-CM | POA: Diagnosis not present

## 2023-07-31 DIAGNOSIS — M25571 Pain in right ankle and joints of right foot: Secondary | ICD-10-CM | POA: Diagnosis not present

## 2023-08-26 ENCOUNTER — Other Ambulatory Visit: Payer: Self-pay

## 2023-08-26 ENCOUNTER — Emergency Department
Admission: EM | Admit: 2023-08-26 | Discharge: 2023-08-26 | Disposition: A | Discharge status: Home / Self Care | Payer: Medicaid Other | Attending: Emergency Medicine | Admitting: Emergency Medicine

## 2023-08-26 ENCOUNTER — Ambulatory Visit: Payer: Self-pay

## 2023-08-26 DIAGNOSIS — I10 Essential (primary) hypertension: Secondary | ICD-10-CM | POA: Diagnosis not present

## 2023-08-26 DIAGNOSIS — Z79899 Other long term (current) drug therapy: Secondary | ICD-10-CM | POA: Insufficient documentation

## 2023-08-26 DIAGNOSIS — R519 Headache, unspecified: Secondary | ICD-10-CM | POA: Diagnosis present

## 2023-08-26 LAB — CBC WITH DIFFERENTIAL/PLATELET
Abs Immature Granulocytes: 0.02 10*3/uL (ref 0.00–0.07)
Basophils Absolute: 0.1 10*3/uL (ref 0.0–0.1)
Basophils Relative: 1 %
Eosinophils Absolute: 0.3 10*3/uL (ref 0.0–0.5)
Eosinophils Relative: 4 %
HCT: 36.9 % — ABNORMAL LOW (ref 39.0–52.0)
Hemoglobin: 12.7 g/dL — ABNORMAL LOW (ref 13.0–17.0)
Immature Granulocytes: 0 %
Lymphocytes Relative: 19 %
Lymphs Abs: 1.2 10*3/uL (ref 0.7–4.0)
MCH: 30 pg (ref 26.0–34.0)
MCHC: 34.4 g/dL (ref 30.0–36.0)
MCV: 87 fL (ref 80.0–100.0)
Monocytes Absolute: 0.6 10*3/uL (ref 0.1–1.0)
Monocytes Relative: 9 %
Neutro Abs: 4.4 10*3/uL (ref 1.7–7.7)
Neutrophils Relative %: 67 %
Platelets: 293 10*3/uL (ref 150–400)
RBC: 4.24 MIL/uL (ref 4.22–5.81)
RDW: 13.3 % (ref 11.5–15.5)
WBC: 6.5 10*3/uL (ref 4.0–10.5)
nRBC: 0 % (ref 0.0–0.2)

## 2023-08-26 LAB — TROPONIN I (HIGH SENSITIVITY): Troponin I (High Sensitivity): 7 ng/L (ref <18)

## 2023-08-26 LAB — BASIC METABOLIC PANEL
Anion gap: 9 (ref 5–15)
BUN: 18 mg/dL (ref 6–20)
CO2: 24 mmol/L (ref 22–32)
Calcium: 8.5 mg/dL — ABNORMAL LOW (ref 8.9–10.3)
Chloride: 108 mmol/L (ref 98–111)
Creatinine, Ser: 1.15 mg/dL (ref 0.61–1.24)
GFR, Estimated: >60 mL/min (ref >60)
Glucose, Bld: 124 mg/dL — ABNORMAL HIGH (ref 70–99)
Potassium: 3.8 mmol/L (ref 3.5–5.1)
Sodium: 141 mmol/L (ref 135–145)

## 2023-08-26 MED ORDER — CARVEDILOL 25 MG PO TABS: 25.0000 mg | ORAL_TABLET | Freq: Two times a day (BID) | ORAL | 2 refills | Status: AC

## 2023-08-26 MED ORDER — CARVEDILOL 25 MG PO TABS: 25.0000 mg | ORAL_TABLET | Freq: Two times a day (BID) | ORAL | 11 refills | Status: AC

## 2023-08-26 NOTE — Telephone Encounter (Signed)
  Chief Complaint: High BP reading - HA Symptoms: HA Frequency: ongoing  Pertinent Negatives: Patient denies  Disposition: [] ED /[x] Urgent Care (no appt availability in office) / [] Appointment(In office/virtual)/ []  [facility]/ [] Home Care/ [] Refused Recommended Disposition /[] [facility]/ []  Follow-up with PCP Additional Notes: Pt has hx of CHF and HTN. Bp readings are 201/134, 241/214. Pt has not missed any HTN medication. Pt has  HA. Advised pt to be seen at PCP or UC today.Pt will go to UC for care.  Reason for Disposition  [1] Systolic BP  >= 200 OR Diastolic >= 120 AND [2] having NO cardiac or neurologic symptoms  Answer Assessment - Initial Assessment Questions 1. BLOOD PRESSURE: What is the blood pressure? Did you take at least two measurements 5 minutes apart?     201/134 pulse of 97, 241/214, 200/116 2. ONSET: When did you take your blood pressure?     Just now 3. HOW: How did you take your blood pressure? (e.g., automatic home BP monitor, visiting nurse)     Home monitor 4. HISTORY: Do you have a history of high blood pressure?     yes 5. MEDICINES: Are you taking any medicines for blood pressure? Have you missed any doses recently?     Wasn't missed 6. OTHER SYMPTOMS: Do you have any symptoms? (e.g., blurred vision, chest pain, difficulty breathing, headache, weakness)     HA  Protocols used: Blood Pressure - High-A-AH

## 2023-08-26 NOTE — Discharge Instructions (Addendum)
 You should increase the carvedilol  to 25 mg twice daily.  The maximum dose for losartan  is what you are already on, 100 mg daily.  Keep a log of your blood pressures over the next 1 to 2 weeks.  Make an appointment to follow-up with your primary care doctor.  Return to the ER for new, worsening, or persistent severe elevated blood pressures especially over 180-200 on the top number or 120 on the bottom number, chest pain, severe headache, difficulty breathing, leg swelling, or any other new or worsening symptoms that concern you.

## 2023-08-26 NOTE — ED Provider Notes (Signed)
 [facility] Provider Note    Event Date/Time   First MD Initiated Contact with Patient 08/26/23 1307     (approximate)   History   Hypertension   HPI  Matthew Marquez is a 53 y.o. male with a history of hypertension who presents with elevated blood pressures over the last week with readings as high as 220/120 at home.  The patient reports associated headaches although he states that he has had similar headaches previously.  He denies any chest pain or difficulty breathing.  He has no increased leg swelling.  He takes carvedilol  12.5 mg twice daily as well as losartan  50 mg twice daily and states he has been compliant.  I reviewed the past medical records; the patient was most recently seen in the walk in clinic on 12/15 for foot pain after an injury.  Previously he was seen in the family medicine clinic on 10/3 of last year for hypertension.  At that time he was taking losartan , carvedilol , and chlorthalidone .  Currently he is on 75 mg of losartan  daily and 12.5 mg of carvedilol .   Physical Exam   Triage Vital Signs: ED Triage Vitals  Encounter Vitals Group     BP 08/26/23 1300 (!) 199/125     Systolic BP Percentile --      Diastolic BP Percentile --      Pulse Rate 08/26/23 1258 87     Resp 08/26/23 1258 17     Temp 08/26/23 1258 98.1 F (36.7 C)     Temp src --      SpO2 08/26/23 1258 97 %     Weight 08/26/23 1258 230 lb (104.3 kg)     Height 08/26/23 1258 6' 3 (1.905 m)     Head Circumference --      Peak Flow --      Pain Score 08/26/23 1258 9     Pain Loc --      Pain Education --      Exclude from Growth Chart --     Most recent vital signs: Vitals:   08/26/23 1330 08/26/23 1512  BP: (!) 167/113 (!) 165/107  Pulse: 82 79  Resp: 16 18  Temp:    SpO2: 100% 99%     General: Awake, no distress.  CV:  Good peripheral perfusion.  Resp:  Normal effort.  Abd:  No distention.  Other:  No significant peripheral edema   ED Results  / Procedures / Treatments   Labs (all labs ordered are listed, but only abnormal results are displayed) Labs Reviewed  BASIC METABOLIC PANEL - Abnormal; Notable for the following components:      Result Value   Glucose, Bld 124 (*)    Calcium  8.5 (*)    All other components within normal limits  CBC WITH DIFFERENTIAL/PLATELET - Abnormal; Notable for the following components:   Hemoglobin 12.7 (*)    HCT 36.9 (*)    All other components within normal limits  CBC WITH DIFFERENTIAL/PLATELET  TROPONIN I (HIGH SENSITIVITY)     EKG  ED ECG REPORT I, Waylon Cassis, the attending physician, personally viewed and interpreted this ECG.  Date: 08/26/2023 EKG Time: 1311 Rate: 87 Rhythm: normal sinus rhythm QRS Axis: normal Intervals: normal ST/T Wave abnormalities: normal Narrative Interpretation: no evidence of acute ischemia    RADIOLOGY     PROCEDURES:  Critical Care performed: No  Procedures   MEDICATIONS ORDERED IN ED: Medications - No data to display  IMPRESSION / MDM / ASSESSMENT AND PLAN / ED COURSE  I reviewed the triage vital signs and the nursing notes.  53 year old male with PMH as noted above presents with elevated blood pressures over the last week.  He has headaches which are not new but no other acute symptoms.  Initial blood pressure here is 199/125.  Physical exam is otherwise unremarkable for acute findings.  The patient states he took extra doses of his medications at home and his blood pressure is now 167/113.  Differential diagnosis includes, but is not limited to, hypertension.  The patient has a longstanding history of high blood pressure that he states has been difficult to control.  He has no specific symptoms to suggest end organ dysfunction or hypertensive crisis.  EKG is nonischemic.  We will obtain basic labs, troponin, and reassess.  Patient's presentation is most consistent with exacerbation of chronic illness.  The patient is on  the cardiac monitor to evaluate for evidence of arrhythmia and/or significant heart rate changes.   ----------------------------------------- 3:12 PM on 08/26/2023 -----------------------------------------  BMP shows normal creatinine and GFR.  CBC is normal.  Troponin is negative.  Given the duration of the symptoms there is no indication for repeat.  At this time, the patient is stable for discharge home.  I counseled him on the results of the workup.  I recommend he increase the dose of carvedilol  to 25 mg twice daily and have prescribed this for him.  I gave strict return precautions and he expressed understanding.   FINAL CLINICAL IMPRESSION(S) / ED DIAGNOSES   Final diagnoses:  Hypertension, unspecified type     Rx / DC Orders   ED Discharge Orders          Ordered    carvedilol  (COREG ) 25 MG tablet  2 times daily        08/26/23 1505    carvedilol  (COREG ) 25 MG tablet  2 times daily        08/26/23 1505             Note:  This document was prepared using Dragon voice recognition software and may include unintentional dictation errors.    Jacolyn Pae, MD 08/26/23 1536

## 2023-08-26 NOTE — ED Triage Notes (Signed)
 Pt to ED for HTN, reports taking extra bp meds today. +h/a intermittent x2 days.

## 2023-08-30 ENCOUNTER — Other Ambulatory Visit: Payer: Self-pay

## 2023-08-30 DIAGNOSIS — Z0000 Encounter for general adult medical examination without abnormal findings: Secondary | ICD-10-CM

## 2023-08-30 DIAGNOSIS — N1831 Chronic kidney disease, stage 3a: Secondary | ICD-10-CM

## 2023-08-30 DIAGNOSIS — E782 Mixed hyperlipidemia: Secondary | ICD-10-CM

## 2023-08-30 DIAGNOSIS — I10 Essential (primary) hypertension: Secondary | ICD-10-CM

## 2023-09-03 ENCOUNTER — Other Ambulatory Visit: Payer: Self-pay | Admitting: Family Medicine

## 2023-09-03 DIAGNOSIS — I10 Essential (primary) hypertension: Secondary | ICD-10-CM

## 2023-09-12 ENCOUNTER — Other Ambulatory Visit: Payer: BC Managed Care – PPO

## 2023-09-19 ENCOUNTER — Encounter: Payer: BC Managed Care – PPO | Admitting: Family Medicine

## 2023-09-22 ENCOUNTER — Encounter: Payer: Self-pay | Admitting: Family Medicine

## 2023-09-24 ENCOUNTER — Encounter: Payer: Self-pay | Admitting: Family Medicine

## 2023-09-27 ENCOUNTER — Other Ambulatory Visit: Payer: Self-pay | Admitting: Family Medicine

## 2023-09-27 DIAGNOSIS — I10 Essential (primary) hypertension: Secondary | ICD-10-CM

## 2023-11-16 DIAGNOSIS — E782 Mixed hyperlipidemia: Secondary | ICD-10-CM | POA: Diagnosis not present

## 2023-11-16 DIAGNOSIS — I10 Essential (primary) hypertension: Secondary | ICD-10-CM | POA: Diagnosis not present

## 2023-11-16 DIAGNOSIS — I509 Heart failure, unspecified: Secondary | ICD-10-CM | POA: Diagnosis not present

## 2023-11-17 ENCOUNTER — Other Ambulatory Visit: Payer: Self-pay | Admitting: Family Medicine

## 2023-11-17 DIAGNOSIS — K219 Gastro-esophageal reflux disease without esophagitis: Secondary | ICD-10-CM

## 2023-11-24 ENCOUNTER — Other Ambulatory Visit: Payer: Self-pay | Admitting: Family Medicine

## 2023-11-24 DIAGNOSIS — E782 Mixed hyperlipidemia: Secondary | ICD-10-CM

## 2023-12-05 DIAGNOSIS — Z136 Encounter for screening for cardiovascular disorders: Secondary | ICD-10-CM | POA: Diagnosis not present

## 2023-12-14 DIAGNOSIS — E782 Mixed hyperlipidemia: Secondary | ICD-10-CM | POA: Diagnosis not present

## 2023-12-14 DIAGNOSIS — R0602 Shortness of breath: Secondary | ICD-10-CM | POA: Diagnosis not present

## 2023-12-14 DIAGNOSIS — R0609 Other forms of dyspnea: Secondary | ICD-10-CM | POA: Diagnosis not present

## 2023-12-14 DIAGNOSIS — I10 Essential (primary) hypertension: Secondary | ICD-10-CM | POA: Diagnosis not present

## 2023-12-20 ENCOUNTER — Other Ambulatory Visit: Payer: Self-pay | Admitting: Cardiology

## 2023-12-20 DIAGNOSIS — E782 Mixed hyperlipidemia: Secondary | ICD-10-CM

## 2023-12-20 DIAGNOSIS — R0609 Other forms of dyspnea: Secondary | ICD-10-CM

## 2023-12-20 DIAGNOSIS — I10 Essential (primary) hypertension: Secondary | ICD-10-CM

## 2024-01-16 ENCOUNTER — Ambulatory Visit

## 2024-01-20 DIAGNOSIS — I10 Essential (primary) hypertension: Secondary | ICD-10-CM | POA: Diagnosis not present

## 2024-01-20 DIAGNOSIS — R0609 Other forms of dyspnea: Secondary | ICD-10-CM | POA: Diagnosis not present

## 2024-01-29 ENCOUNTER — Other Ambulatory Visit: Payer: Self-pay | Admitting: Family Medicine

## 2024-01-29 DIAGNOSIS — I10 Essential (primary) hypertension: Secondary | ICD-10-CM

## 2024-02-13 DIAGNOSIS — I10 Essential (primary) hypertension: Secondary | ICD-10-CM | POA: Diagnosis not present

## 2024-02-24 ENCOUNTER — Other Ambulatory Visit: Payer: Self-pay | Admitting: Family Medicine

## 2024-02-24 DIAGNOSIS — I10 Essential (primary) hypertension: Secondary | ICD-10-CM

## 2024-03-01 ENCOUNTER — Telehealth: Payer: Self-pay | Admitting: *Deleted

## 2024-03-01 NOTE — Telephone Encounter (Signed)
 LVM for pt to call office, we received a refill on carvedilol  and it looks like pt has established somewhere else.  If he is still a patient here he will need to schedule an appt and then we can see if provider will send in medication, if not we need to remove Matthew Marquez as PCP so he is no longer attached to this office.

## 2024-05-26 ENCOUNTER — Other Ambulatory Visit: Payer: Self-pay | Admitting: Family Medicine

## 2024-05-26 DIAGNOSIS — I10 Essential (primary) hypertension: Secondary | ICD-10-CM

## 2024-05-30 ENCOUNTER — Other Ambulatory Visit: Payer: Self-pay | Admitting: Family Medicine

## 2024-05-30 DIAGNOSIS — E782 Mixed hyperlipidemia: Secondary | ICD-10-CM

## 2024-06-01 ENCOUNTER — Telehealth: Payer: Self-pay

## 2024-06-01 NOTE — Telephone Encounter (Signed)
 LVM letting pt know that Dr.Olson did refill medication however appointment will be needed for any other refills.  Please schedule PHYSICAL AND LABS when he returns the call

## 2024-08-06 ENCOUNTER — Other Ambulatory Visit: Payer: Self-pay

## 2024-08-06 DIAGNOSIS — I10 Essential (primary) hypertension: Secondary | ICD-10-CM

## 2024-08-15 ENCOUNTER — Other Ambulatory Visit: Payer: Self-pay | Admitting: Cardiology

## 2024-08-15 DIAGNOSIS — R9439 Abnormal result of other cardiovascular function study: Secondary | ICD-10-CM

## 2024-08-22 ENCOUNTER — Telehealth (HOSPITAL_COMMUNITY): Payer: Self-pay | Admitting: *Deleted

## 2024-08-22 ENCOUNTER — Telehealth (HOSPITAL_COMMUNITY): Payer: Self-pay | Admitting: Emergency Medicine

## 2024-08-22 NOTE — Telephone Encounter (Signed)
 Attempted to call patient regarding upcoming cardiac CT appointment. Left message on voicemail with name and callback number  Larey Brick RN Navigator Cardiac Imaging [facility] [phone] Office [phone] Cell

## 2024-08-22 NOTE — Telephone Encounter (Signed)
 Reaching out to patient to offer assistance regarding upcoming cardiac imaging study; pt verbalizes understanding of appt date/time, parking situation and where to check in, pre-test NPO status and medications ordered, and verified current allergies; name and call back number provided for further questions should they arise Rockwell Alexandria RN Navigator Cardiac Imaging [facility] [phone] office [phone] cell

## 2024-08-23 ENCOUNTER — Ambulatory Visit
Admission: RE | Admit: 2024-08-23 | Discharge: 2024-08-23 | Disposition: A | Discharge status: Home / Self Care | Source: Ambulatory Visit | Attending: Cardiology | Admitting: Cardiology

## 2024-08-23 DIAGNOSIS — R9439 Abnormal result of other cardiovascular function study: Secondary | ICD-10-CM | POA: Diagnosis present

## 2024-08-23 LAB — POCT I-STAT CREATININE: Creatinine, Ser: 1.5 mg/dL — ABNORMAL HIGH (ref 0.61–1.24)

## 2024-08-23 MED ORDER — IOHEXOL 350 MG/ML SOLN
100.0000 mL | Freq: Once | INTRAVENOUS | Status: AC | PRN
  Administered 2024-08-23: 100 mL via INTRAVENOUS

## 2024-08-23 MED ORDER — DILTIAZEM HCL 25 MG/5ML IV SOLN
10.0000 mg | INTRAVENOUS | Status: DC | PRN
  Filled 2024-08-23: qty 5

## 2024-08-23 MED ORDER — METOPROLOL TARTRATE 5 MG/5ML IV SOLN
10.0000 mg | Freq: Once | INTRAVENOUS | Status: DC | PRN
  Filled 2024-08-23: qty 10

## 2024-08-23 MED ORDER — NITROGLYCERIN 0.4 MG SL SUBL
0.8000 mg | SUBLINGUAL_TABLET | Freq: Once | SUBLINGUAL | Status: AC
  Administered 2024-08-23: 0.8 mg via SUBLINGUAL
  Filled 2024-08-23: qty 25

## 2024-08-23 NOTE — Progress Notes (Signed)
 Patient tolerated procedure well. W/C to lobby.  Ambulate w/o difficulty. Denies light headedness or being dizzy. Encouraged to drink extra water today and reasoning explained. Verbalized understanding. All questions answered. ABC intact. No further needs. Discharge from procedure area w/o issues.
